# Patient Record
Sex: Male | Born: 1955 | Race: White | Hispanic: No | Marital: Married | Smoking: Light tobacco smoker
Health system: Southern US, Community
[De-identification: ages and names within clinical notes are randomized; demographics above are authoritative.]

## PROBLEM LIST (undated history)

## (undated) DIAGNOSIS — T7840XA Allergy, unspecified, initial encounter: Secondary | ICD-10-CM

## (undated) DIAGNOSIS — J45909 Unspecified asthma, uncomplicated: Secondary | ICD-10-CM

## (undated) DIAGNOSIS — C4490 Unspecified malignant neoplasm of skin, unspecified: Secondary | ICD-10-CM

## (undated) DIAGNOSIS — Z9989 Dependence on other enabling machines and devices: Secondary | ICD-10-CM

## (undated) DIAGNOSIS — J449 Chronic obstructive pulmonary disease, unspecified: Secondary | ICD-10-CM

## (undated) DIAGNOSIS — M1990 Unspecified osteoarthritis, unspecified site: Secondary | ICD-10-CM

## (undated) DIAGNOSIS — K219 Gastro-esophageal reflux disease without esophagitis: Secondary | ICD-10-CM

## (undated) DIAGNOSIS — I2510 Atherosclerotic heart disease of native coronary artery without angina pectoris: Secondary | ICD-10-CM

## (undated) DIAGNOSIS — I10 Essential (primary) hypertension: Secondary | ICD-10-CM

## (undated) DIAGNOSIS — G4733 Obstructive sleep apnea (adult) (pediatric): Secondary | ICD-10-CM

## (undated) DIAGNOSIS — N059 Unspecified nephritic syndrome with unspecified morphologic changes: Secondary | ICD-10-CM

## (undated) DIAGNOSIS — G4730 Sleep apnea, unspecified: Secondary | ICD-10-CM

## (undated) HISTORY — PX: WISDOM TOOTH EXTRACTION: SHX21

## (undated) HISTORY — DX: Sleep apnea, unspecified: G47.30

## (undated) HISTORY — PX: SKIN BIOPSY: SHX1

## (undated) HISTORY — DX: Unspecified osteoarthritis, unspecified site: M19.90

## (undated) HISTORY — DX: Gastro-esophageal reflux disease without esophagitis: K21.9

## (undated) HISTORY — DX: Unspecified asthma, uncomplicated: J45.909

## (undated) HISTORY — PX: COLONOSCOPY: SHX174

## (undated) HISTORY — DX: Unspecified malignant neoplasm of skin, unspecified: C44.90

## (undated) HISTORY — DX: Dependence on other enabling machines and devices: Z99.89

## (undated) HISTORY — DX: Obstructive sleep apnea (adult) (pediatric): G47.33

## (undated) HISTORY — PX: FINGER SURGERY: SHX640

## (undated) HISTORY — DX: Allergy, unspecified, initial encounter: T78.40XA

---

## 2010-10-27 HISTORY — PX: CORONARY ANGIOPLASTY: SHX604

## 2014-09-04 ENCOUNTER — Emergency Department (HOSPITAL_COMMUNITY): Payer: 59

## 2014-09-04 ENCOUNTER — Encounter (HOSPITAL_COMMUNITY): Payer: Self-pay | Admitting: Emergency Medicine

## 2014-09-04 ENCOUNTER — Emergency Department (HOSPITAL_COMMUNITY)
Admission: EM | Admit: 2014-09-04 | Discharge: 2014-09-04 | Disposition: A | Discharge status: Home / Self Care | Payer: 59 | Attending: Emergency Medicine | Admitting: Emergency Medicine

## 2014-09-04 DIAGNOSIS — R0789 Other chest pain: Secondary | ICD-10-CM | POA: Insufficient documentation

## 2014-09-04 DIAGNOSIS — I2510 Atherosclerotic heart disease of native coronary artery without angina pectoris: Secondary | ICD-10-CM | POA: Diagnosis not present

## 2014-09-04 DIAGNOSIS — I10 Essential (primary) hypertension: Secondary | ICD-10-CM | POA: Diagnosis not present

## 2014-09-04 DIAGNOSIS — J449 Chronic obstructive pulmonary disease, unspecified: Secondary | ICD-10-CM | POA: Insufficient documentation

## 2014-09-04 DIAGNOSIS — R9389 Abnormal findings on diagnostic imaging of other specified body structures: Secondary | ICD-10-CM

## 2014-09-04 DIAGNOSIS — Z87448 Personal history of other diseases of urinary system: Secondary | ICD-10-CM | POA: Insufficient documentation

## 2014-09-04 DIAGNOSIS — R079 Chest pain, unspecified: Secondary | ICD-10-CM

## 2014-09-04 DIAGNOSIS — Z85828 Personal history of other malignant neoplasm of skin: Secondary | ICD-10-CM | POA: Diagnosis not present

## 2014-09-04 DIAGNOSIS — Z87891 Personal history of nicotine dependence: Secondary | ICD-10-CM | POA: Insufficient documentation

## 2014-09-04 DIAGNOSIS — R918 Other nonspecific abnormal finding of lung field: Secondary | ICD-10-CM | POA: Insufficient documentation

## 2014-09-04 HISTORY — DX: Chronic obstructive pulmonary disease, unspecified: J44.9

## 2014-09-04 HISTORY — DX: Essential (primary) hypertension: I10

## 2014-09-04 HISTORY — DX: Atherosclerotic heart disease of native coronary artery without angina pectoris: I25.10

## 2014-09-04 HISTORY — DX: Unspecified nephritic syndrome with unspecified morphologic changes: N05.9

## 2014-09-04 LAB — BASIC METABOLIC PANEL
ANION GAP: 15 (ref 5–15)
BUN: 12 mg/dL (ref 6–23)
CHLORIDE: 98 meq/L (ref 96–112)
CO2: 24 meq/L (ref 19–32)
Calcium: 9.2 mg/dL (ref 8.4–10.5)
Creatinine, Ser: 0.88 mg/dL (ref 0.50–1.35)
GFR calc non Af Amer: >90 mL/min (ref >90)
Glucose, Bld: 105 mg/dL — ABNORMAL HIGH (ref 70–99)
Potassium: 4.6 mEq/L (ref 3.7–5.3)
SODIUM: 137 meq/L (ref 137–147)

## 2014-09-04 LAB — I-STAT TROPONIN, ED: Troponin i, poc: 0.02 ng/mL (ref 0.00–0.08)

## 2014-09-04 LAB — CBC
HEMATOCRIT: 46 % (ref 39.0–52.0)
HEMOGLOBIN: 15.9 g/dL (ref 13.0–17.0)
MCH: 30.6 pg (ref 26.0–34.0)
MCHC: 34.6 g/dL (ref 30.0–36.0)
MCV: 88.6 fL (ref 78.0–100.0)
Platelets: 214 10*3/uL (ref 150–400)
RBC: 5.19 MIL/uL (ref 4.22–5.81)
RDW: 13 % (ref 11.5–15.5)
WBC: 6.4 10*3/uL (ref 4.0–10.5)

## 2014-09-04 NOTE — ED Notes (Signed)
Pt states he wake up earlier today when to the gym and while exercising he started feeling some CP that he describe as chest pressure and some nausea.

## 2014-09-04 NOTE — Discharge Instructions (Signed)

## 2014-09-04 NOTE — ED Notes (Signed)
Patient transported to X-ray 

## 2014-09-04 NOTE — ED Provider Notes (Signed)
CSN: [id number]     Arrival date & time 09/04/14  4259 History   First MD Initiated Contact with Patient 09/04/14 0701     Chief Complaint  Patient presents with  . Chest Pain  . Nausea     (Consider location/radiation/quality/duration/timing/severity/associated sxs/prior Treatment) HPI   58yM with CP and nausea. Onset earlier this morning. Pt was at gym an felt fine lifting weights. Then went to exercise bike and during this began to have nausea and feel dizzy. Stopped because of this. Symptoms began to get worse so cut his work out short. Tightness in chest, upper back, shoulders but he has had this for past week which he attributes to working out this different gym equipment than he is use to. Pt just moved to [place] from [place] ~2 week ago. Symptoms resolved over about 30 minutes. Currently no complaints. Medical hx lists CAD, but pt denies. Says he had catheterization 3 years ago and coronary arteries "were pristine." "My cardiologist told me I would die from something else before I ever developed blockages." Hx of COPD / asthma. Symptoms this morning did not feel like this. No fever or chills. No cough. No unusual leg pain or swelling. No established medical care in area yet.   Past Medical History  Diagnosis Date  . Coronary artery disease   . COPD (chronic obstructive pulmonary disease)   . Nephritis   . Hypertension   . Cancer     skin   Past Surgical History  Procedure Laterality Date  . Skin biopsy    . Coronary angiogram     History reviewed. No pertinent family history. History  Substance Use Topics  . Smoking status: Former Smoker    Types: Cigarettes  . Smokeless tobacco: Former Systems developer  . Alcohol Use: Yes     Comment: weekends    Review of Systems  All systems reviewed and negative, other than as noted in HPI.   Allergies  Review of patient's allergies indicates not on file.  Home Medications   Prior to Admission medications   Not on File   BP  126/73 mmHg  Pulse 65  Temp(Src) 97.5 F (36.4 C) (Oral)  Resp 12  Ht 5\' 6"  (1.676 m)  Wt 245 lb (111.131 kg)  BMI 39.56 kg/m2  SpO2 100% Physical Exam  Constitutional: He appears well-developed and well-nourished. No distress.  Laying in bed. NAD. Obese.   HENT:  Head: Normocephalic and atraumatic.  Eyes: Conjunctivae are normal. Right eye exhibits no discharge. Left eye exhibits no discharge.  Neck: Neck supple.  Cardiovascular: Normal rate, regular rhythm and normal heart sounds.  Exam reveals no gallop and no friction rub.   No murmur heard. Pulmonary/Chest: Effort normal and breath sounds normal. No respiratory distress. He exhibits no tenderness.  Abdominal: Soft. He exhibits no distension. There is no tenderness.  Musculoskeletal: He exhibits no edema or tenderness.  Lower extremities symmetric as compared to each other. No calf tenderness. Negative Homan's. No palpable cords.   Neurological: He is alert.  Skin: Skin is warm and dry. He is not diaphoretic.  Psychiatric: He has a normal mood and affect. His behavior is normal. Thought content normal.  Nursing note and vitals reviewed.   ED Course  Procedures (including critical care time) Labs Review Labs Reviewed  BASIC METABOLIC PANEL - Abnormal; Notable for the following:    Glucose, Bld 105 (*)    All other components within normal limits  CBC  I-STAT TROPOININ, ED  Imaging Review Dg Chest 2 View  09/04/2014   CLINICAL DATA:  Chest tightness, shoulder tightness, chest pain, began today when he went to the [facility] hypertension, former smoker, personal history of coronary artery disease, COPD, hypertension, cancer (type not specified in EHR)  EXAM: CHEST  2 VIEW  COMPARISON:  None  FINDINGS: Normal heart size and pulmonary vascularity.  Density at the RIGHT cardiophrenic angle could represent a prominent epicardial fat pad though mass is not completely excluded.  Lungs clear.  No pleural effusion or pneumothorax.   Minimal broad-based thoracic dextro convex scoliosis.  IMPRESSION: No acute cardiopulmonary abnormalities identified.  Question prominent fat pad at RIGHT cardiophrenic angle though mass not entirely excluded.  If patient has any prior outside chest radiographs recommend these be obtained for comparison to assess stability.  In the absence of prior exams, noncontrast CT chest recommended to exclude mass.   Electronically Signed   By: Lavonia Dana M.D.   On: 09/04/2014 08:15   Ct Chest Wo Contrast  09/04/2014   CLINICAL DATA:  Abnormal HGD:JMEQASTM prominent fat pad at RIGHT cardiophrenic angle, though mass is not entirely excluded. Per Radiologist recommendation.  EXAM: CT CHEST WITHOUT CONTRAST  TECHNIQUE: Multidetector CT imaging of the chest was performed following the standard protocol without IV contrast.  COMPARISON:  Chest x-ray on 09/04/2014  FINDINGS: Heart: Heart size is normal. No pericardial effusion or significant coronary artery calcification.  Vascular structures: Normal arch anatomy.  No evidence for aneurysm.  Mediastinum/thyroid: No mediastinal, hilar, or axillary adenopathy. The visualized portion of the thyroid gland has a normal appearance. A prominent right pericardial fat pad accounts for the chest x-ray abnormality.  Lungs/Airways: No pulmonary nodules, pleural effusions, or infiltrates are identified.  Upper abdomen: Right renal cyst is identified. The gallbladder is present. The adrenal glands have a normal appearance.  Chest wall/osseous structures: Negative.  IMPRESSION: 1. Abnormality on recent chest x-ray is a prominent pericardial fat pad, benign. 2.  No evidence for acute  abnormality.   Electronically Signed   By: Shon Hale M.D.   On: 09/04/2014 09:22     EKG Interpretation   Date/Time:  Monday September 04 2014 06:56:40 EST Ventricular Rate:  61 PR Interval:  247 QRS Duration: 83 QT Interval:  398 QTC Calculation: 401 R Axis:   -27 Text Interpretation:  Sinus rhythm  Prolonged PR interval Baseline wander  in lead(s) V1 Non-specific ST-t changes No old tracing to compare  Confirmed by Edith Endave  MD, Clayvon Parlett ([id number]) on 09/04/2014 7:03:05 AM      MDM   Final diagnoses:  Abnormal CXR  Chest pain, unspecified chest pain type    58yM with CP. Symptoms somewhat atypical. CP/shoulder pain/upper back pain has been ongoing for last week before episodes of nausea/dizziness today. Actually worsened after getting off bike. Currently symptom free. EKG with nonspecific changes. No old for comparison. Lungs clear. No cough. Doubt PE, infectious, dissection, or other emergent pathology.   Will check CXR. Basic labs and trop.   W/u fairly unremarkable aside from CXR. Unfortunately, no old records. Pt not sure of specifics of prior chest radiographs. No local established care. Will CT today.   CT reassuring. Low risk CP. I feel safe for DC. Outpt FU information provided.   Virgel Manifold, MD 09/04/14 1018

## 2014-09-06 ENCOUNTER — Encounter: Payer: 59 | Admitting: Cardiovascular Disease

## 2014-09-06 NOTE — Progress Notes (Signed)
No show

## 2014-09-17 NOTE — Progress Notes (Signed)
Patient ID: Charles Mosley, male   DOB: December 19, 1955, 58 y.o.   MRN: [id number]    58 yo seen in ER for nausea an chest pain 11/9  D/c with negative w/u   Pain in early morning . Pt was at gym an felt fine lifting weights. Then went to exercise bike and during this began to have nausea and feel dizzy. Stopped because of this. Symptoms began to get worse so cut his work out short. Tightness in chest, upper back, shoulders but he has had this for past week which he attributes to working out this different gym equipment than he is use to. Pt just moved to [place] from [place]  At end of October . Symptoms resolved over about 30 minutes. Currently no complaints. Medical hx lists CAD, but pt denies. Says he had catheterization 3 years ago and coronary arteries "were pristine." "My cardiologist told me I would die from something else before I ever developed blockages." Hx of COPD / asthma.    CXR 11/10 reviewed an NA   Despite being overweight he goes to gym regularly with no pain or tightness.  Moved here with wife who is working at [employer].  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  All other systems reviewed and negative   General: Affect appropriate Overweight white male  HEENT: normal Neck supple with no adenopathy JVP normal no bruits no thyromegaly Lungs clear with no wheezing and good diaphragmatic motion Heart:  S1/S2 no murmur,rub, gallop or click PMI normal Abdomen: benighn, BS positve, no tenderness, no AAA no bruit.  No HSM or HJR Distal pulses intact with no bruits No edema Neuro non-focal Skin warm and dry No muscular weakness  Medications Current Outpatient Prescriptions  Medication Sig Dispense Refill  . albuterol (PROVENTIL  HFA;VENTOLIN HFA) 108 (90 BASE) MCG/ACT inhaler Inhale 1 puff into the lungs every 6 (six) hours as needed for wheezing or shortness of breath.    Marland Kitchen aspirin EC 81 MG tablet Take 81 mg by mouth every evening.    . Aspirin-Salicylamide-Caffeine (BC HEADACHE PO) Take 1 packet by mouth daily as needed (for headache).    . docusate sodium (COLACE) 100 MG capsule Take 100 mg by mouth daily as needed for mild constipation.    . fexofenadine (ALLEGRA) 60 MG tablet Take 60 mg by mouth daily.    . Fluticasone-Salmeterol (ADVAIR) 250-50 MCG/DOSE AEPB Inhale 1 puff into the lungs 2 (two) times daily.    . montelukast (SINGULAIR) 10 MG tablet Take 10 mg by mouth every evening.    . nebivolol (BYSTOLIC) 5 MG tablet Take 2.5 mg by mouth every evening.    . valsartan-hydrochlorothiazide (DIOVAN-HCT) 160-25 MG per tablet Take 1 tablet by mouth every evening.     No current facility-administered medications for this visit.    Allergies Review of patient's allergies indicates no known allergies.  Family History: No family history on file.  Social History: History   Social History  . Marital Status: Married    Spouse Name: N/A    Number of Children: N/A  . Years of Education: N/A   Occupational History  . Not on file.   Social History Main Topics  . Smoking status: Former Smoker    Types: Cigarettes  . Smokeless tobacco: Former Systems developer  .  Alcohol Use: Yes     Comment: weekends  . Drug Use: No  . Sexual Activity: Not on file   Other Topics Concern  . Not on file   Social History Narrative  . No narrative on file    Past Surgical History  Procedure Laterality Date  . Skin biopsy    . Coronary angiogram      Past Medical History  Diagnosis Date  . Coronary artery disease   . COPD (chronic obstructive pulmonary disease)   . Nephritis   . Hypertension   . Cancer     skin    Electrocardiogram:  11/10  SR rate 67 poor R wave progression PR 247   Assessment and Plan

## 2014-09-18 ENCOUNTER — Ambulatory Visit (INDEPENDENT_AMBULATORY_CARE_PROVIDER_SITE_OTHER): Payer: 59 | Admitting: Cardiovascular Disease

## 2014-09-18 ENCOUNTER — Encounter: Payer: Self-pay | Admitting: Cardiovascular Disease

## 2014-09-18 VITALS — BP 134/68 | HR 71 | Ht 66.0 in | Wt 239.8 lb

## 2014-09-18 DIAGNOSIS — R079 Chest pain, unspecified: Secondary | ICD-10-CM | POA: Insufficient documentation

## 2014-09-18 DIAGNOSIS — E663 Overweight: Secondary | ICD-10-CM

## 2014-09-18 DIAGNOSIS — R072 Precordial pain: Secondary | ICD-10-CM

## 2014-09-18 DIAGNOSIS — I10 Essential (primary) hypertension: Secondary | ICD-10-CM

## 2014-09-18 NOTE — Assessment & Plan Note (Signed)
Atypical Likely related to new gym equipment and exercises.  Previous cath with normal cors and negative ER w/u  F/U in a year Can do ETT in future if he has more symptoms that are typical

## 2014-09-18 NOTE — Assessment & Plan Note (Signed)
Well controlled.  Continue current medications and low sodium Dash type diet.    

## 2014-09-18 NOTE — Patient Instructions (Signed)
Your physician wants you to follow-up in:  [place] will receive a reminder letter in the mail two months in advance. If you don't receive a letter, please call our office to schedule the follow-up appointment. Your physician recommends that you continue on your current medications as directed. Please refer to the Current Medication list given to you today.

## 2014-09-18 NOTE — Assessment & Plan Note (Signed)
Discussed low carb diet.  Also discussed more aerobic activities like recumbent bike and elliptical

## 2014-10-02 ENCOUNTER — Encounter: Payer: 59 | Admitting: Cardiovascular Disease

## 2015-02-08 ENCOUNTER — Institutional Professional Consult (permissible substitution): Payer: Self-pay | Admitting: Internal Medicine

## 2015-02-13 ENCOUNTER — Encounter: Payer: Self-pay | Admitting: Internal Medicine

## 2015-02-13 ENCOUNTER — Ambulatory Visit (INDEPENDENT_AMBULATORY_CARE_PROVIDER_SITE_OTHER): Payer: BC Managed Care – PPO | Admitting: Internal Medicine

## 2015-02-13 VITALS — BP 136/80 | HR 56 | Ht 66.0 in | Wt 254.4 lb

## 2015-02-13 DIAGNOSIS — G4730 Sleep apnea, unspecified: Secondary | ICD-10-CM

## 2015-02-13 DIAGNOSIS — J4540 Moderate persistent asthma, uncomplicated: Secondary | ICD-10-CM

## 2015-02-13 DIAGNOSIS — J4541 Moderate persistent asthma with (acute) exacerbation: Secondary | ICD-10-CM | POA: Insufficient documentation

## 2015-02-13 DIAGNOSIS — G4733 Obstructive sleep apnea (adult) (pediatric): Secondary | ICD-10-CM | POA: Insufficient documentation

## 2015-02-13 NOTE — Progress Notes (Signed)
Subjective:    Patient ID: Charles Mosley, male    DOB: 06-07-1956, 59 y.o.   MRN: [id number] PCP Eloise Levels, NP  HPI  IOV 02/13/2015  Chief Complaint  Patient presents with  . :Pulmonary Consult    Self Referral - Pt moved here from [place] and needs to establish with new pulmonary md -Asthma with some copd - Asthma worsened 3 years ago - Occas sob after eating or with increased stress - Denies cough or wheezing  - Pt also uses cpap    59 year-old morbidly obese male. Used to work for [employer] post and [place] today. 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 Wife also relocated here to work for [employer]. He reports a diagnosis of asthma made over 10 years ago. He's been on Advair which she takes only 1 puff at night along with Singulair for the last few years. His last visit with pulmonologist Dr. Meredith Pel in [place] was he says over a year ago. Last prednisone was over a year ago. Last permit function tests and chest x-ray was over a year ago. Limited outside records from Dr. Meredith Pel shows that he had a clear chest x-ray in December 2014. He was also subjected to methacholine challenge test per the results are known. Patient himself does not recollect any of these test results. Currently feels his asthma is extremely well controlled. His last albuterol use was several months ago. On the 5. asthma control test score was 22 showing good control  Specifically in the last 4 weeks he does not feel his asthma is preventing him from getting any of his work done. He admits to shortness of breath 3-6 times a week in the last 4 weeks. But he does not have any other asthma symptoms at this wheezing coughing chest tightness awaking up at night. Also in the last 4 weeks is not uses albuterol for rescue. Overall he feels his asthma is well controlled.  He does have chronic sleep apnea and uses CPAP but he does not have a sleep  Dr.  Marcelline Deist main purpose of this visit today is to establish care in [place] and get refills  His exhaled nitric oxide FeNO is 24 ppb and shows good control of asthma   Asthma Control Panel 02/13/2015   Current Med Regimen advair at night + singulair at night  ACQ 5 point- 1 week (wtd avg score) x  ACQ 7 point - 1 week (wtd avg score) x  ACT - 4 week (total score, <19= symptoms) 22  FeNO ppB 24 ppb on 02/13/2015   FeV1  x  Planned intervention  for visit cotninue advair/singulair - no change in baseline regime       has a past medical history of Coronary artery disease; COPD (chronic obstructive pulmonary disease); Nephritis; Hypertension; Cancer; and Sleep apnea.   reports that he has been smoking Cigars.  He has quit using smokeless tobacco. he says that he picked up only smoking 10 years ago and he smokes anywhere between 6 and 20 cigars in a single year when  outdoors. Otherwise he doesn't smoke much  Past Surgical History  Procedure Laterality Date  . Skin biopsy    . Coronary angiogram      No Known Allergies  Immunization History  Administered Date(s) Administered  . Influenza Split 07/27/2014    Family History  Problem Relation Age of Onset  . Cancer Mother   .  Emphysema Father      Current outpatient prescriptions:  .  albuterol (PROVENTIL HFA;VENTOLIN HFA) 108 (90 BASE) MCG/ACT inhaler, Inhale 1 puff into the lungs every 6 (six) hours as needed for wheezing or shortness of breath., Disp: , Rfl:  .  aspirin EC 81 MG tablet, Take 81 mg by mouth every evening., Disp: , Rfl:  .  docusate sodium (COLACE) 100 MG capsule, Take 100 mg by mouth daily as needed for mild constipation., Disp: , Rfl:  .  fexofenadine (ALLEGRA) 60 MG tablet, Take 60 mg by mouth daily., Disp: , Rfl:  .  Fluticasone-Salmeterol (ADVAIR) 250-50 MCG/DOSE AEPB, Inhale 1 puff into the lungs 2 (two) times daily., Disp: , Rfl:  .  montelukast (SINGULAIR) 10 MG tablet, Take 10 mg by mouth every  evening., Disp: , Rfl:  .  nebivolol (BYSTOLIC) 5 MG tablet, Take 2.5 mg by mouth every evening., Disp: , Rfl:  .  valsartan (DIOVAN) 160 MG tablet, Take 160 mg by mouth daily., Disp: , Rfl:      Review of Systems  Constitutional: Negative.   HENT: Negative.   Eyes: Negative.   Respiratory: Positive for apnea and shortness of breath. Negative for cough, choking, chest tightness, wheezing and stridor.   Cardiovascular: Negative.   Gastrointestinal: Negative.   Endocrine: Negative.   Genitourinary: Negative.   Musculoskeletal: Negative.   Skin: Negative.   Allergic/Immunologic: Negative.   Neurological: Negative.   Hematological: Negative.   Psychiatric/Behavioral: Negative.        Objective:   Physical Exam  Constitutional: He is oriented to person, place, and time. He appears well-developed and well-nourished. No distress.  obese  HENT:  Head: Normocephalic and atraumatic.  Right Ear: External ear normal.  Left Ear: External ear normal.  Mouth/Throat: Oropharynx is clear and moist. No oropharyngeal exudate.  mallampatti class 3  Eyes: Conjunctivae and EOM are normal. Pupils are equal, round, and reactive to light. Right eye exhibits no discharge. Left eye exhibits no discharge. No scleral icterus.  Neck: Normal range of motion. Neck supple. No JVD present. No tracheal deviation present. No thyromegaly present.  Cardiovascular: Normal rate, regular rhythm and intact distal pulses.  Exam reveals no gallop and no friction rub.   No murmur heard. Pulmonary/Chest: Effort normal and breath sounds normal. No respiratory distress. He has no wheezes. He has no rales. He exhibits no tenderness.  Abdominal: Soft. Bowel sounds are normal. He exhibits no distension and no mass. There is no tenderness. There is no rebound and no guarding.  Musculoskeletal: Normal range of motion. He exhibits no edema or tenderness.  Lymphadenopathy:    He has no cervical adenopathy.  Neurological: He is  alert and oriented to person, place, and time. He has normal reflexes. No cranial nerve deficit. Coordination normal.  Skin: Skin is warm and dry. No rash noted. He is not diaphoretic. No erythema. No pallor.  Psychiatric: He has a normal mood and affect. His behavior is normal. Judgment and thought content normal.  Nursing note and vitals reviewed.   Filed Vitals:   02/13/15 1123  BP: 136/80  Pulse: 56  Height: 5\' 6"  (1.676 m)  Weight: 254 lb 6.4 oz (115.395 kg)  SpO2: 97%         Assessment & Plan:     ICD-9-CM ICD-10-CM   1. Moderate persistent asthma, uncomplicated 268.34 H96.22 Pulmonary function test  2. Sleep apnea 780.57 G47.30 Pulmonary function test    Asthma appears well controlled based on  ACT questions + FeNO Continue advair 1 puff twice daily  Continue singulair at night daily Continue albuterol as needed Obtain outside records from Dr Meredith Pel in [place], [place]  Please see a sleep doc in our office next 3 months  Followup  - see sleep doc in our office next 3 months anytime  - full pft in  6 months - return to see me in 6 months after PFT   Dr. Brand Males, M.D., [facility].C.P Pulmonary and Critical Care Medicine Staff Physician [facility] Pager: [phone], If no answer or between  15:00h - 7:00h: call [phone]  02/13/2015 11:58 AM

## 2015-02-13 NOTE — Patient Instructions (Addendum)
ICD-9-CM ICD-10-CM   1. Moderate persistent asthma, uncomplicated 789.38 B01.75   2. Sleep apnea 780.57 G47.30    Asthma appears well controlled Continue advair 1 puff twice daily Continue singulair at night daily Continue albuterol as needed Obtain outside records from Dr Meredith Pel in [place], [place]  Please see a sleep doc in our office next 3 months  Followup  - see sleep doc in our office next 3 months anytime  - full pft in  6 months - return to see me in 6 months after PFT

## 2015-05-18 ENCOUNTER — Encounter: Payer: Self-pay | Admitting: Internal Medicine

## 2015-05-18 ENCOUNTER — Ambulatory Visit (INDEPENDENT_AMBULATORY_CARE_PROVIDER_SITE_OTHER): Payer: BC Managed Care – PPO | Admitting: Internal Medicine

## 2015-05-18 VITALS — BP 112/82 | HR 56 | Ht 67.0 in | Wt 232.2 lb

## 2015-05-18 DIAGNOSIS — G4733 Obstructive sleep apnea (adult) (pediatric): Secondary | ICD-10-CM | POA: Diagnosis not present

## 2015-05-18 DIAGNOSIS — E663 Overweight: Secondary | ICD-10-CM

## 2015-05-18 NOTE — Assessment & Plan Note (Signed)
He indicates 30 pound weight loss over the last 2 years. He is encouraged to continue losing weight which would significantly help with management of this obstructive sleep apnea.

## 2015-05-18 NOTE — Patient Instructions (Signed)
Order- Needs to establish local DME for continuation of CPAP - auto 5-15, mask of choice, humidifier, supplies    Dx OSA  Please call as needed

## 2015-05-18 NOTE — Assessment & Plan Note (Signed)
We have requested a copy of his diagnostic sleep study from his previous physician in [place]. Download documents good compliance and control with an auto Pap range between 5 and 15. He tolerates that very well so we will not change. He continues with a fullface mask. We discussed comfort and compliance guidelines. Plan-establish local DME for continuation of CPAP auto 5-15

## 2015-05-18 NOTE — Progress Notes (Signed)
05/18/15- 59 yoM light cigar smoker referred by Dr Chase Caller for sleep medicine evaluation with dx OSA Follow For: Sleep Apnea. Pt had home sleep study done about 3 years ago in [place]. He has now moved to this area and needs to establish a DME company. Medical problems include asthma, HBP Download from [facility] CPAP/ [employer] indicates good compliance and control on auto 5-15. He has been very comfortable with that pressure range using a fullface mask. His current machine is about 61 years old and seen to be working well. He thinks he has lost 30 pounds in the last 2 years but remains heavy. Snoring has been stopped by CPAP and he feels rested. History of multiple nasal fractures, most from playing softball he does not breathe well through his nose.  Prior to Admission medications   Medication Sig Start Date End Date Taking? Authorizing Provider  albuterol (PROVENTIL HFA;VENTOLIN HFA) 108 (90 BASE) MCG/ACT inhaler Inhale 1 puff into the lungs every 6 (six) hours as needed for wheezing or shortness of breath.   Yes Historical Provider, MD  aspirin EC 81 MG tablet Take 81 mg by mouth every evening.   Yes Historical Provider, MD  BYSTOLIC 2.5 MG tablet Take 2.5 mg by mouth daily. 05/10/15  Yes Historical Provider, MD  docusate sodium (COLACE) 100 MG capsule Take 100 mg by mouth daily as needed for mild constipation.   Yes Historical Provider, MD  fexofenadine (ALLEGRA) 60 MG tablet Take 60 mg by mouth daily.   Yes Historical Provider, MD  Fluticasone-Salmeterol (ADVAIR) 250-50 MCG/DOSE AEPB Inhale 1 puff into the lungs 2 (two) times daily.   Yes Historical Provider, MD  montelukast (SINGULAIR) 10 MG tablet Take 10 mg by mouth every evening.   Yes Historical Provider, MD  valsartan (DIOVAN) 160 MG tablet Take 160 mg by mouth daily.   Yes Historical Provider, MD   Past Medical History  Diagnosis Date  . Coronary artery disease   . COPD (chronic obstructive pulmonary disease)   . Nephritis    . Hypertension   . Cancer     skin  . Sleep apnea    Past Surgical History  Procedure Laterality Date  . Skin biopsy    . Coronary angiogram     Family History  Problem Relation Age of Onset  . Cancer Mother   . Emphysema Father    History   Social History  . Marital Status: Married    Spouse Name: N/A  . Number of Children: N/A  . Years of Education: N/A   Occupational History  . Unemployed     Social History Main Topics  . Smoking status: Light Tobacco Smoker    Types: Cigars  . Smokeless tobacco: Former Systems developer  . Alcohol Use: Yes     Comment: weekends  . Drug Use: No  . Sexual Activity: Not on file   Other Topics Concern  . Not on file   Social History Narrative   ROS-see HPI   Negative unless "+" Constitutional:    weight loss, night sweats, fevers, chills, fatigue, lassitude. HEENT:    headaches, difficulty swallowing, tooth/dental problems, sore throat,       sneezing, itching, ear ache, +nasal congestion, post nasal drip, snoring CV:    chest pain, orthopnea, PND, swelling in lower extremities, anasarca,  dizziness, palpitations Resp:   +shortness of breath with exertion or at rest.                productive cough,   non-productive cough, coughing up of blood.              change in color of mucus.  wheezing.   Skin:    rash or lesions. GI:  +heartburn, indigestion, abdominal pain, nausea, vomiting, diarrhea,                 change in bowel habits, loss of appetite GU: dysuria, change in color of urine, no urgency or frequency.   flank pain. MS:   joint pain, stiffness, decreased range of motion, back pain. Neuro-     nothing unusual Psych:  change in mood or affect.  depression or anxiety.   memory loss.  OBJ- Physical Exam General- Alert, Oriented, Affect-appropriate, Distress- none acute, + morbidly obese Skin- rash-none, lesions- none, excoriation- none Lymphadenopathy- none Head- atraumatic             Eyes- Gross vision intact, PERRLA, conjunctivae and secretions clear            Ears- Hearing, canals-normal            Nose- Clear, no-Septal dev, mucus, polyps, erosion, perforation             Throat- Mallampati II - III, mucosa clear , drainage- none, tonsils- atrophic Neck- flexible , trachea midline, no stridor , thyroid nl, carotid no bruit Chest - symmetrical excursion , unlabored           Heart/CV- RRR , no murmur , no gallop  , no rub, nl s1 s2                           - JVD- none , edema- none, stasis changes- none, varices- none           Lung- clear to P&A, wheeze- none, cough- none , dullness-none, rub- none           Chest wall-  Abd-  Br/ Gen/ Rectal- Not done, not indicated Extrem- cyanosis- none, clubbing, none, atrophy- none, strength- nl Neuro- grossly intact to observation

## 2015-07-03 ENCOUNTER — Encounter: Payer: Self-pay | Admitting: *Deleted

## 2015-07-04 ENCOUNTER — Ambulatory Visit: Payer: BC Managed Care – PPO | Admitting: Cardiology

## 2015-07-05 ENCOUNTER — Telehealth: Payer: Self-pay | Admitting: Internal Medicine

## 2015-07-05 MED ORDER — MONTELUKAST SODIUM 10 MG PO TABS: 10.0000 mg | ORAL_TABLET | Freq: Every evening | ORAL | Status: DC

## 2015-07-05 NOTE — Telephone Encounter (Signed)
Called spoke with pt. He needs refill on [facility]air [facility]. I have done so. Nothing further needed

## 2015-07-10 ENCOUNTER — Telehealth: Payer: Self-pay | Admitting: Internal Medicine

## 2015-07-10 NOTE — Telephone Encounter (Signed)
Singulair rx sent to [facility] on 07/05/15 for #90 x 0. Spoke with pt and advised of above.  Pt states he did speak with [facility].  Nothing further needed at this time.  Pt will call back if anything further is needed.

## 2015-07-16 ENCOUNTER — Ambulatory Visit (INDEPENDENT_AMBULATORY_CARE_PROVIDER_SITE_OTHER): Payer: BC Managed Care – PPO | Admitting: Cardiology

## 2015-07-16 ENCOUNTER — Encounter: Payer: Self-pay | Admitting: Cardiology

## 2015-07-16 VITALS — BP 128/84 | HR 57 | Ht 66.0 in | Wt 236.1 lb

## 2015-07-16 DIAGNOSIS — I10 Essential (primary) hypertension: Secondary | ICD-10-CM

## 2015-07-16 NOTE — Progress Notes (Signed)
Cardiology Office Note   Date:  07/16/2015   ID:  Charles Mosley, DOB 22-Jul-1956, MRN [id number]  PCP:  Eloise Levels, NP  Cardiologist:  Dr. Johnsie Cancel    Chief Complaint  Patient presents with  . Chest Pain    pt c/o no chest pain or any other problems  . Shortness of Breath    some      History of Present Illness: Coal Nearhood is a 59 y.o. male who presents for follow up for chest pain, though no cardiac chest pain.  He does have brief episode after wt lifting that resolves with stretching.  No chest pain, no SOB.  He does wt lifting MWF and bike those days , other days elliptical.  His diet is low on fruit and vegetables.  No diabetes.  His labs followed by PCP ie lipids.  He knows he needs follow up with them in next few months.   He had a cardiac catheterization 4 years ago and coronary arteries "were pristine." "My cardiologist told me I would die from something else before I ever developed blockages." Hx of COPD / asthma followed by pulmonary.   Past Medical History  Diagnosis Date  . Coronary artery disease   . COPD (chronic obstructive pulmonary disease)   . Nephritis   . Hypertension   . Skin cancer   . OSA on CPAP   . Asthma     Past Surgical History  Procedure Laterality Date  . Skin biopsy    . Coronary angioplasty  2012     Current Outpatient Prescriptions  Medication Sig Dispense Refill  . albuterol (PROVENTIL HFA;VENTOLIN HFA) 108 (90 BASE) MCG/ACT inhaler Inhale 1 puff into the lungs every 6 (six) hours as needed for wheezing or shortness of breath.    Marland Kitchen aspirin EC 81 MG tablet Take 81 mg by mouth every evening.    Marland Kitchen BYSTOLIC 2.5 MG tablet Take 2.5 mg by mouth daily.    Marland Kitchen docusate sodium (COLACE) 100 MG capsule Take 100 mg by mouth daily as needed for mild constipation.    . fexofenadine (ALLEGRA) 60 MG tablet Take 60 mg by mouth daily.    . Fluticasone-Salmeterol (ADVAIR) 250-50 MCG/DOSE AEPB Inhale 1 puff into the lungs 2 (two) times daily.      . montelukast (SINGULAIR) 10 MG tablet Take 1 tablet (10 mg total) by mouth every evening. 90 tablet 0  . valsartan (DIOVAN) 160 MG tablet Take 160 mg by mouth daily.     No current facility-administered medications for this visit.    Allergies:   Review of patient's allergies indicates no known allergies.    Social History:  The patient  reports that he has been smoking Cigars.  He has quit using smokeless tobacco. He reports that he drinks alcohol. He reports that he does not use illicit drugs.   Family History:  The patient's family history includes Cancer in his mother; Emphysema in his father.    ROS:  General:no colds or fevers, + weight loss- 30 lbs per pt.  Skin:no rashes or ulcers HEENT:no blurred vision, no congestion CV:see HPI PUL:see HPI GI:no diarrhea constipation or melena, no indigestion GU:no hematuria, no dysuria MS:+ knee pain, no claudication, occ leg cramps Neuro:no syncope, no lightheadedness Endo:no diabetes, no thyroid disease  Wt Readings from Last 3 Encounters:  07/16/15 236 lb 1.6 oz (107.094 kg)  05/18/15 232 lb 3.2 oz (105.325 kg)  02/13/15 254 lb 6.4 oz (115.395 kg)  PHYSICAL EXAM: VS:  BP 128/84 mmHg  Pulse 57  Ht 5\' 6"  (1.676 m)  Wt 236 lb 1.6 oz (107.094 kg)  BMI 38.13 kg/m2 , BMI Body mass index is 38.13 kg/(m^2). General:Pleasant affect, NAD Skin:Warm and dry, brisk capillary refill HEENT:normocephalic, sclera clear, mucus membranes moist Neck:supple, no JVD, no bruits  Heart:S1S2 RRR without murmur, gallup, rub or click Lungs:clear without rales, rhonchi, or wheezes SFK:CLEXN, soft, non tender, + BS, do not palpate liver spleen or masses Ext:no lower ext edema, 2+ pedal pulses, 2+ radial pulses Neuro:alert and oriented X 3, MAE, follows commands, + facial symmetry    EKG:  EKG is ordered today. The ekg ordered today demonstrates SB no acute changes.    Recent Labs: 09/04/2014: BUN 12; Creatinine, Ser 0.88; Hemoglobin 15.9;  Platelets 214; Potassium 4.6; Sodium 137    Lipid Panel No results found for: CHOL, TRIG, HDL, CHOLHDL, VLDL, LDLCALC, LDLDIRECT     Other studies Reviewed: Additional studies/ records that were reviewed today include: previous notes.   ASSESSMENT AND PLAN:  1.  Muscular skeletal chest pain.  If symptoms increase or occur with exertion he will call us. Chol check per PCP along with meds.  Follow up with Dr. Johnsie Cancel in 1 year.  2. Obesity- he exercises freq and is working on his wt.      Current medicines are reviewed with the patient today.  The patient Has no concerns regarding medicines.  The following changes have been made:  See above Labs/ tests ordered today include:see above  Disposition:   FU:  see above  Lennie Muckle, NP  07/16/2015 4:17 PM    [facility] [address], [place], [place] Phone: [phone]; Fax: [phone]

## 2015-07-16 NOTE — Patient Instructions (Signed)
Your physician wants you to follow-up in: 1 year with Dr. Johnsie Cancel. You will receive a reminder letter in the mail two months in advance. If you don't receive a letter, please call our office to schedule the follow-up appointment.   Your physician recommends that you schedule a follow-up appointment in the near future with your primary care physician.

## 2015-07-25 ENCOUNTER — Ambulatory Visit: Payer: BC Managed Care – PPO | Admitting: Internal Medicine

## 2015-08-10 ENCOUNTER — Ambulatory Visit: Payer: BC Managed Care – PPO | Admitting: Internal Medicine

## 2015-09-14 ENCOUNTER — Ambulatory Visit: Payer: BC Managed Care – PPO | Admitting: Internal Medicine

## 2015-09-27 ENCOUNTER — Ambulatory Visit: Payer: BC Managed Care – PPO | Admitting: Cardiovascular Disease

## 2015-11-27 ENCOUNTER — Telehealth: Payer: Self-pay | Admitting: Internal Medicine

## 2015-11-27 NOTE — Telephone Encounter (Signed)
Pt is due for follow up w/ PFT's w/ MR. LMTCB x1

## 2015-11-28 MED ORDER — MONTELUKAST SODIUM 10 MG PO TABS: 10.0000 mg | ORAL_TABLET | Freq: Every evening | ORAL | Status: DC

## 2015-11-28 MED ORDER — ALBUTEROL SULFATE HFA 108 (90 BASE) MCG/ACT IN AERS: 1.0000 | INHALATION_SPRAY | Freq: Four times a day (QID) | RESPIRATORY_TRACT | Status: DC | PRN

## 2015-11-28 MED ORDER — FLUTICASONE-SALMETEROL 250-50 MCG/DOSE IN AEPB: 1.0000 | INHALATION_SPRAY | Freq: Two times a day (BID) | RESPIRATORY_TRACT | Status: DC

## 2015-11-28 NOTE — Telephone Encounter (Signed)
i can sign it

## 2015-11-28 NOTE — Telephone Encounter (Signed)
Patient Returned call [phone]

## 2015-11-28 NOTE — Telephone Encounter (Signed)
Patient lost his job and has gone through his 51K.  He says that he has been in contact with [facility] program to help pay for his prescriptions and he needs hand-written prescriptions to send with his application for his Advair, Singulair and Albuterol inhaler.  Dr. Chase Caller is out of office today, Dr. Lamonte Sakai, do you mind signing Rx so patient can pick up today?    Please advise.

## 2015-11-28 NOTE — Telephone Encounter (Signed)
Pt returning call.Charles Mosley ° °

## 2015-11-28 NOTE — Telephone Encounter (Signed)
LMTCB

## 2015-11-28 NOTE — Telephone Encounter (Signed)
LVM for patient to return call. 

## 2015-11-28 NOTE — Telephone Encounter (Signed)
Rx printed and signed by Dr. Lamonte Sakai. Placed at front for patient to pick up. Patient aware that Rx is ready for pick up. Nothing further needed.

## 2015-11-29 ENCOUNTER — Telehealth: Payer: Self-pay | Admitting: Internal Medicine

## 2015-11-29 NOTE — Telephone Encounter (Signed)
LMTCB x1  We do not have samples of singulair.

## 2015-11-29 NOTE — Telephone Encounter (Signed)
Pt returned call and I let him know that we don't have samples of singulair and he was fine with that.Hillery Hunter

## 2016-01-29 IMAGING — CT CT CHEST W/O CM
2 of 3 series · 15 of 36 positions shown, 18 images · non-contrast
Comparison: Chest x-ray on 09/04/2014

CLINICAL DATA: Abnormal cxr:Question prominent fat pad at RIGHT
cardiophrenic angle, though mass is not entirely excluded. Per
Radiologist recommendation.

EXAM:
CT CHEST WITHOUT CONTRAST
TECHNIQUE: Multidetector CT imaging of the chest was performed following the
standard protocol without IV contrast..

[Series 201: chest without, idose (2) · axial · non-contrast · 0.66mm/px · z∈[-363,-93]mm · 12 of 64 slices shown, 15 images]
[im 5/64  mediastinal]
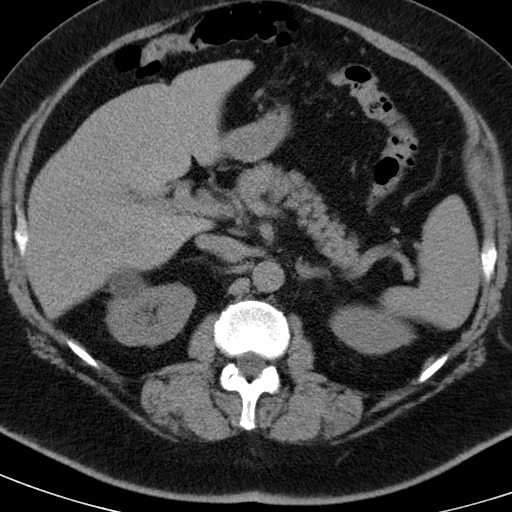
[im 5/64  lung]
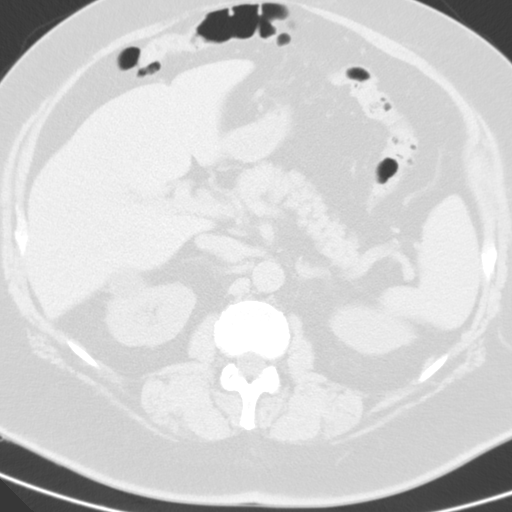
[im 10/64  lung]
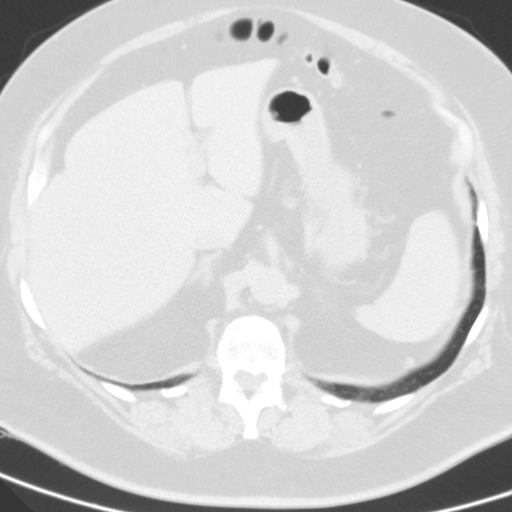
[im 15/64  lung]
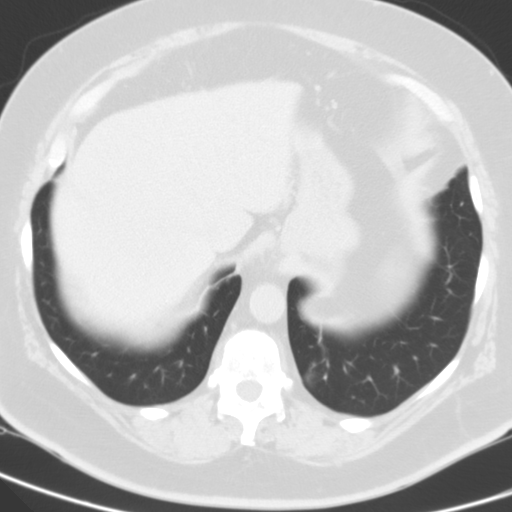
[im 19/64  lung]
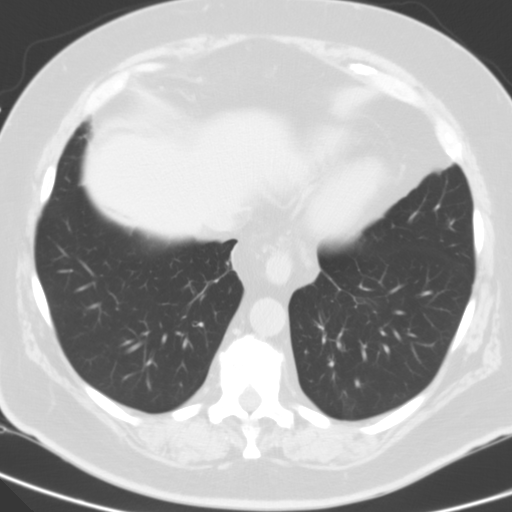
[im 24/64  mediastinal]
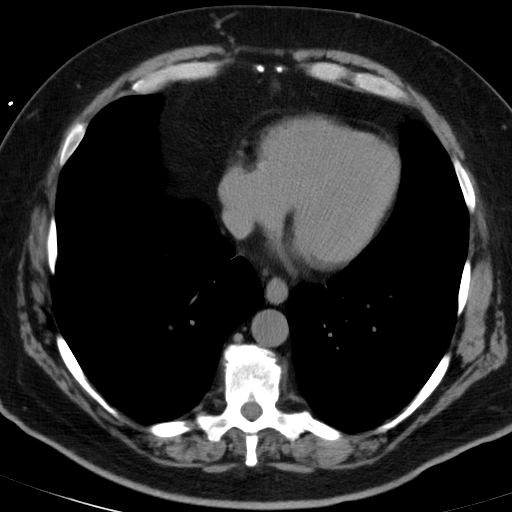
[im 24/64  lung]
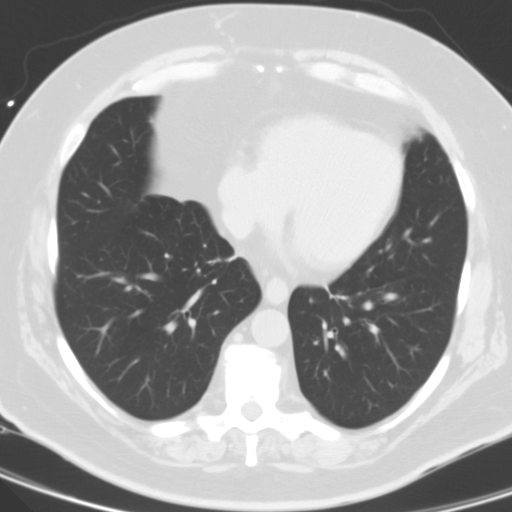
[im 29/64  lung]
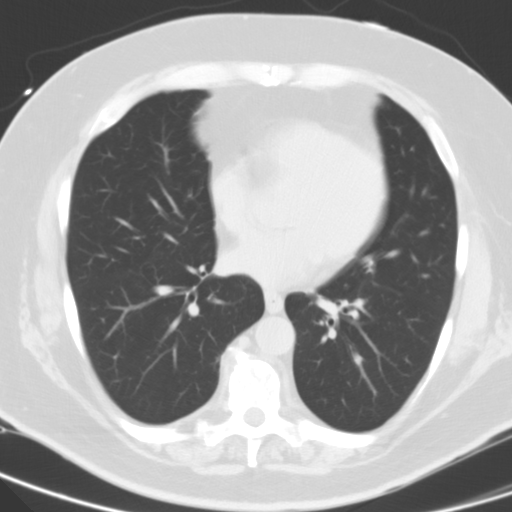
[im 36/64  lung]
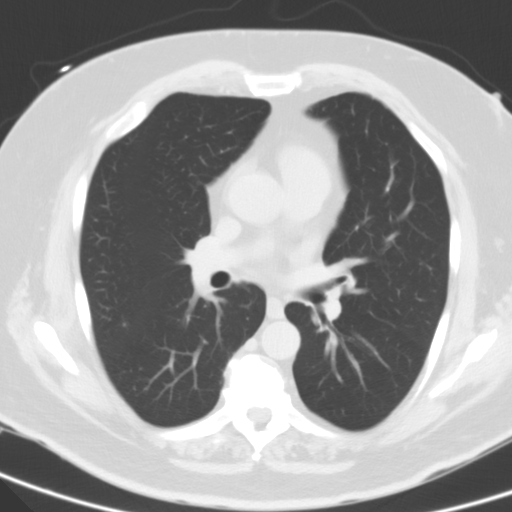
[im 40/64  lung]
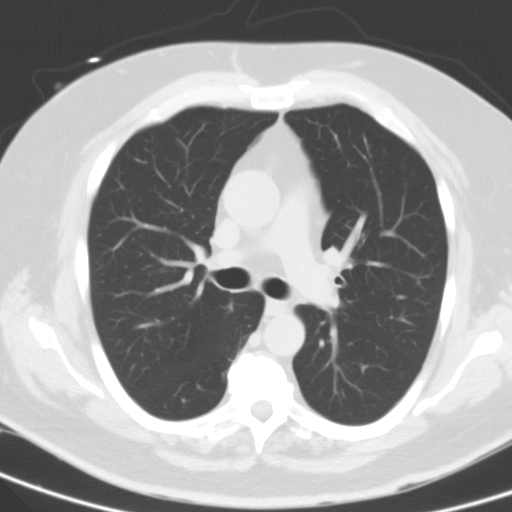
[im 45/64  mediastinal]
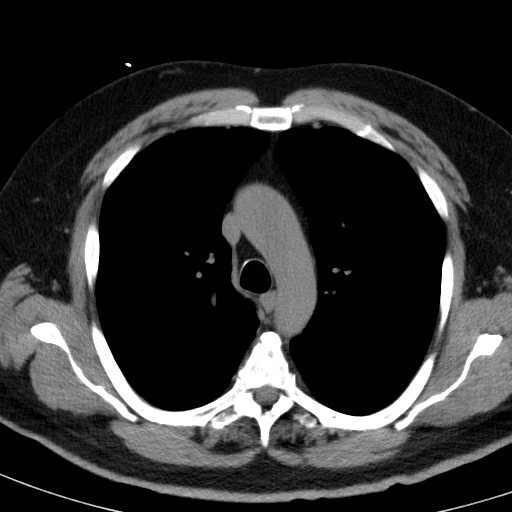
[im 45/64  lung]
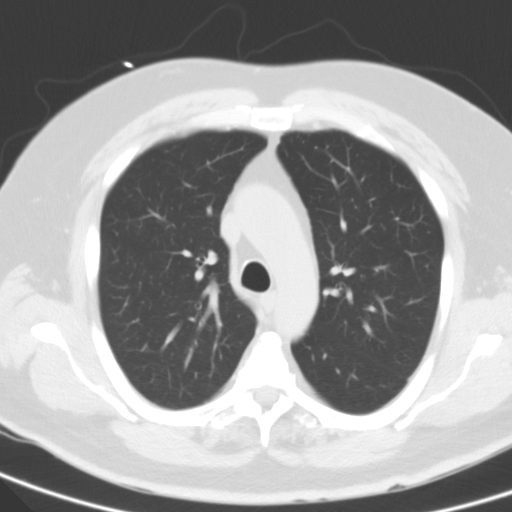
[im 50/64  lung]
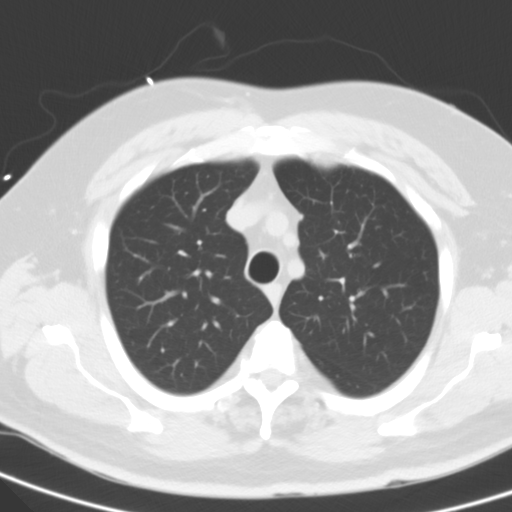
[im 54/64  lung]
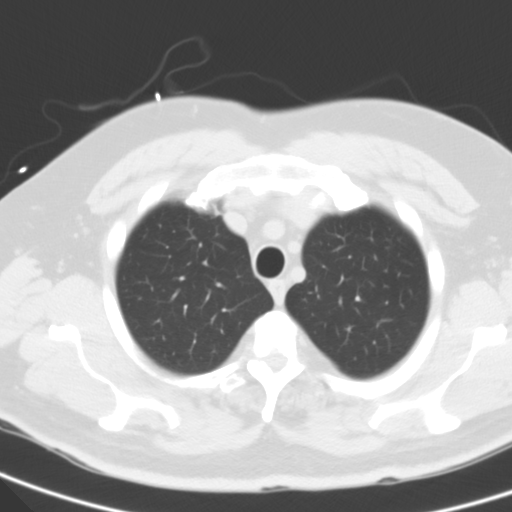
[im 59/64  lung]
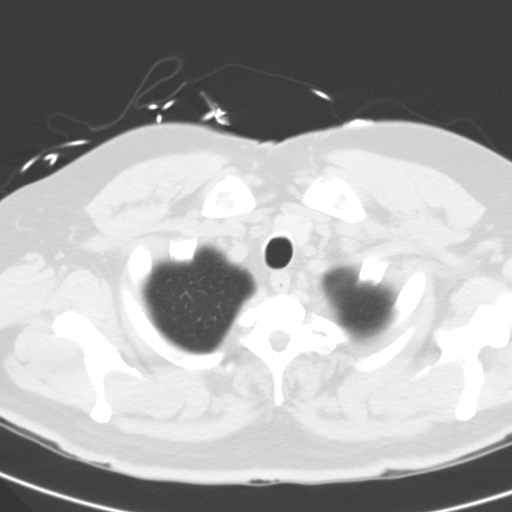

[Series 202: coronal, idose (2) · coronal · 0.50mm/px · 3 of 135 slices shown]
[im 27/135  lung]
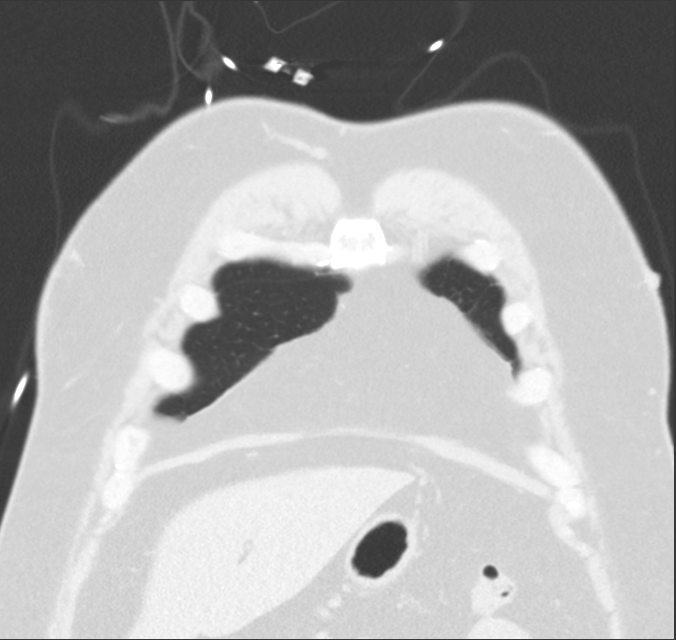
[im 54/135  lung]
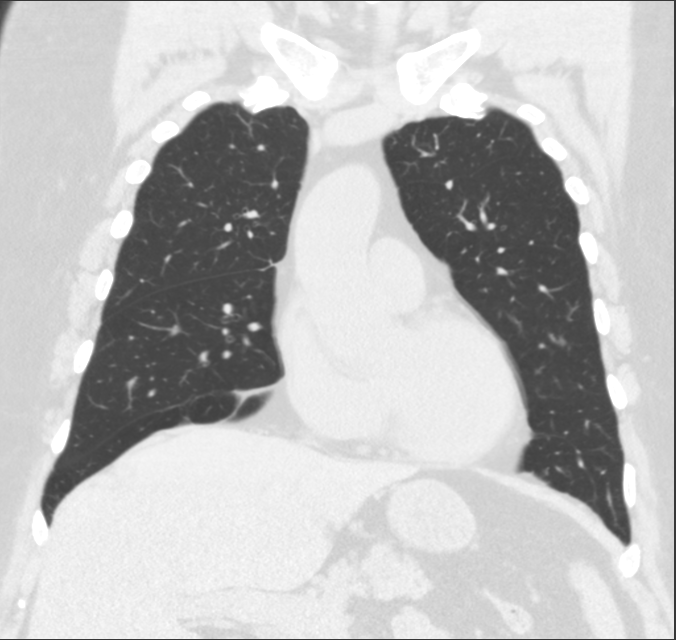
[im 81/135  lung]
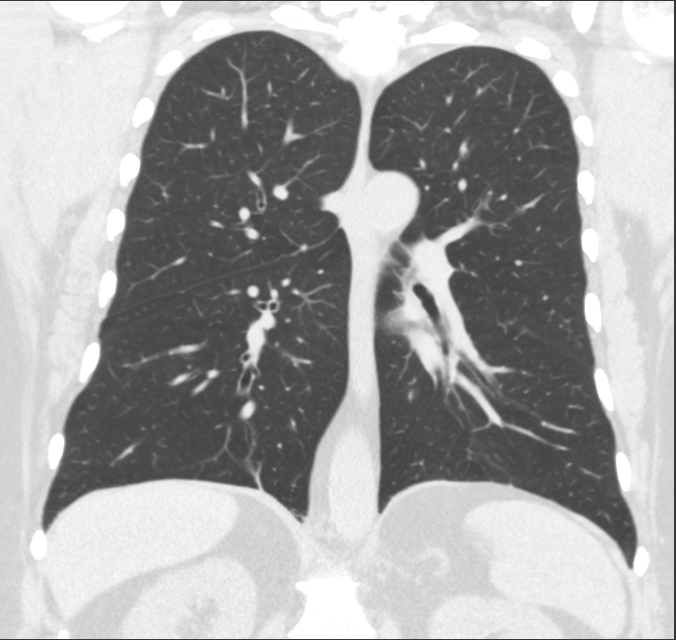

[15 of 36 positions shown; findings below may reference images not displayed]

FINDINGS: Heart: Heart size is normal. No pericardial effusion or significant
coronary artery calcification.

Vascular structures: Normal arch anatomy.  No evidence for aneurysm.

Mediastinum/thyroid: No mediastinal, hilar, or axillary adenopathy.
The visualized portion of the thyroid gland has a normal appearance.
A prominent right pericardial fat pad accounts for the chest x-ray
abnormality.

Lungs/Airways: No pulmonary nodules, pleural effusions, or
infiltrates are identified.

Upper abdomen: Right renal cyst is identified. The gallbladder is
present. The adrenal glands have a normal appearance.

Chest wall/osseous structures: Negative.
IMPRESSION: 1. Abnormality on recent chest x-ray is a prominent pericardial fat
pad, benign.
2.  No evidence for acute  abnormality.

## 2016-11-25 ENCOUNTER — Telehealth: Payer: Self-pay | Admitting: Internal Medicine

## 2016-11-25 NOTE — Telephone Encounter (Signed)
lmomtcb x1 

## 2016-11-26 NOTE — Telephone Encounter (Signed)
lmtcb x2 for pt. 

## 2016-11-27 NOTE — Telephone Encounter (Signed)
lmtcb x1 for pt. 

## 2016-11-27 NOTE — Telephone Encounter (Signed)
Patient returned call.  Call was disconnected after trying to reach triage.

## 2016-11-27 NOTE — Telephone Encounter (Signed)
Pt returning call again.Charles Mosley ° °

## 2016-11-27 NOTE — Telephone Encounter (Signed)
lmtcb x3 for pt. 

## 2016-11-27 NOTE — Telephone Encounter (Signed)
lmtcb

## 2016-12-01 NOTE — Telephone Encounter (Signed)
Lmtcb.  Pt has not been seen since 2016, will need an office visit for refills.

## 2016-12-02 ENCOUNTER — Telehealth: Payer: Self-pay | Admitting: Internal Medicine

## 2016-12-02 NOTE — Telephone Encounter (Signed)
lmtcb x3 for pt. Per triage protocol, message will be closed.  

## 2016-12-02 NOTE — Telephone Encounter (Signed)
Spoke with pt. Advised him that unfortunately we can't refill his medications at this time. We have not seen the pt in over 1.5 years. He would need an appointment. Pt verbalized understanding. Nothing further was needed.

## 2016-12-25 ENCOUNTER — Ambulatory Visit (INDEPENDENT_AMBULATORY_CARE_PROVIDER_SITE_OTHER): Payer: Self-pay | Admitting: Internal Medicine

## 2016-12-25 ENCOUNTER — Encounter: Payer: Self-pay | Admitting: Internal Medicine

## 2016-12-25 DIAGNOSIS — J4541 Moderate persistent asthma with (acute) exacerbation: Secondary | ICD-10-CM

## 2016-12-25 MED ORDER — FLUTICASONE-SALMETEROL 250-50 MCG/DOSE IN AEPB: 1.0000 | INHALATION_SPRAY | Freq: Two times a day (BID) | RESPIRATORY_TRACT | 11 refills | Status: DC

## 2016-12-25 MED ORDER — MONTELUKAST SODIUM 10 MG PO TABS: 10.0000 mg | ORAL_TABLET | Freq: Every evening | ORAL | 3 refills | Status: DC

## 2016-12-25 MED ORDER — PREDNISONE 10 MG PO TABS: ORAL_TABLET | ORAL | 0 refills | Status: DC

## 2016-12-25 NOTE — Progress Notes (Signed)
Subjective:     Patient ID: Charles Mosley, male   DOB: 06/03/1956, 61 y.o.   MRN: [id number]  HPI    IOV 02/13/2015  Chief Complaint  Patient presents with  . :Pulmonary Consult    Self Referral - Pt moved here from [place] and needs to establish with new pulmonary md -Asthma with some copd - Asthma worsened 3 years ago - Occas sob after eating or with increased stress - Denies cough or wheezing  - Pt also uses cpap    61 year-old morbidly obese male. Used to work for [employer] post and [place] today. 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 Wife also relocated here to work for [employer]. He reports a diagnosis of asthma made over 10 years ago. He's been on Advair which she takes only 1 puff at night along with Singulair for the last few years. His last visit with pulmonologist Dr. Meredith Pel in [place] was he says over a year ago. Last prednisone was over a year ago. Last permit function tests and chest x-ray was over a year ago. Limited outside records from Dr. Meredith Pel shows that he had a clear chest x-ray in December 2014. He was also subjected to methacholine challenge test per the results are known. Patient himself does not recollect any of these test results. Currently feels his asthma is extremely well controlled. His last albuterol use was several months ago. On the 5. asthma control test score was 22 showing good control  Specifically in the last 4 weeks he does not feel his asthma is preventing him from getting any of his work done. He admits to shortness of breath 3-6 times a week in the last 4 weeks. But he does not have any other asthma symptoms at this wheezing coughing chest tightness awaking up at night. Also in the last 4 weeks is not uses albuterol for rescue. Overall he feels his asthma is well controlled.  He does have chronic sleep apnea and uses CPAP but he does not have a sleep Dr.  Marcelline Deist main purpose of  this visit today is to establish care in [facility] and get refills  His exhaled nitric oxide FeNO is 24 ppb and shows good control of asthma   OV 12/25/2016  Chief Complaint  Patient presents with  . Follow-up    Pt states overall his breahting feels slightly worse since last OV due to being off medication, pt last seen in 01/2015. Pt denies significant cough and CP/tightness.       61 year old male last seen in April 2016. Since then he says that he lost his job in [employer] and recommended and therefore his health insurance. He now works at [employer] on a part-time basis. He does not have health insurance. Therefore is not taking any inhaled corticosteroids. He only has Singulair the 90s while at I gave him. He says he is able to get it at 3 of cost but still makes it stretch for a whole year. Taking the Singulair only every few to several days. He says on the days he takes Singulair he feels well but has not been feeling poorly. Currently he is reporting lot more symptoms as documented below the asthma control questionnaire. This is one of the good days because he took Singulair last night. This past week is woken up at least a few times at night because of asthma. When he wakes up he feels he has  moderate symptoms. He is not limited in his activities because of asthma. He has a moderate amount of shortness of breath because of asthma and is wheezing a little of the time and using albuterol for rescue at least 3 or 4 times. He still has some difficulty understanding the difference between maintenance inhalers and as rescue inhalers. He is not sure why despite getting the Singulair free of cost is only taking it every few to several days. He's not sure how much of an inhaler would cost him .   no active chest pain or sputum or fever or chills  Asthma Control Panel 02/13/2015  12/25/2016   Current Med Regimen advair at night + singulair at night singulair every few days and alb prn  ACQ  5 point- 1 week (wtd avg score) x 2  ACQ 7 point - 1 week (wtd avg score) x x  ACT - 4 week (total score, <19= symptoms) 22 x  FeNO ppB 24 ppb on 02/13/2015  61ppb  FeV1  x x  Planned intervention  for visit cotninue advair/singulair - no change in baseline regime        has a past medical history of Asthma; COPD (chronic obstructive pulmonary disease) ([place]); Coronary artery disease; Hypertension; Nephritis; OSA on CPAP; and Skin cancer.   reports that he has been smoking Cigars.  He has quit using smokeless tobacco.  Past Surgical History:  Procedure Laterality Date  . CORONARY ANGIOPLASTY  2012  . SKIN BIOPSY      No Known Allergies  Immunization History  Administered Date(s) Administered  . Influenza Split 07/27/2014  . Influenza,inj,Quad PF,36+ Mos 07/27/2016  . Pneumococcal-Unspecified 10/27/2012    Family History  Problem Relation Age of Onset  . Cancer Mother   . Emphysema Father      Current Outpatient Prescriptions:  .  albuterol (PROVENTIL HFA;VENTOLIN HFA) 108 (90 Base) MCG/ACT inhaler, Inhale 1 puff into the lungs every 6 (six) hours as needed for wheezing or shortness of breath., Disp: 3 Inhaler, Rfl: 0 .  aspirin EC 81 MG tablet, Take 81 mg by mouth every evening., Disp: , Rfl:  .  fexofenadine (ALLEGRA) 60 MG tablet, Take 60 mg by mouth daily., Disp: , Rfl:  .  montelukast (SINGULAIR) 10 MG tablet, Take 1 tablet (10 mg total) by mouth every evening., Disp: 90 tablet, Rfl: 0 .  BYSTOLIC 2.5 MG tablet, Take 2.5 mg by mouth daily., Disp: , Rfl:  .  docusate sodium (COLACE) 100 MG capsule, Take 100 mg by mouth daily as needed for mild constipation., Disp: , Rfl:  .  Fluticasone-Salmeterol (ADVAIR) 250-50 MCG/DOSE AEPB, Inhale 1 puff into the lungs 2 (two) times daily. (Patient not taking: Reported on 12/25/2016), Disp: 60 each, Rfl: 0 .  valsartan (DIOVAN) 160 MG tablet, Take 160 mg by mouth daily., Disp: , Rfl:    Review of Systems     Objective:    Physical Exam  Constitutional: He is oriented to person, place, and time. He appears well-developed and well-nourished. No distress.  Has lost weight  HENT:  Head: Normocephalic and atraumatic.  Right Ear: External ear normal.  Left Ear: External ear normal.  Mouth/Throat: Oropharynx is clear and moist. No oropharyngeal exudate.  Eyes: Conjunctivae and EOM are normal. Pupils are equal, round, and reactive to light. Right eye exhibits no discharge. Left eye exhibits no discharge. No scleral icterus.  Neck: Normal range of motion. Neck supple. No JVD present. No tracheal deviation present. No  thyromegaly present.  Cardiovascular: Normal rate, regular rhythm and intact distal pulses.  Exam reveals no gallop and no friction rub.   No murmur heard. Pulmonary/Chest: Effort normal and breath sounds normal. No respiratory distress. He has no wheezes. He has no rales. He exhibits no tenderness.  Abdominal: Soft. Bowel sounds are normal. He exhibits no distension and no mass. There is no tenderness. There is no rebound and no guarding.  Musculoskeletal: Normal range of motion. He exhibits no edema or tenderness.  Lymphadenopathy:    He has no cervical adenopathy.  Neurological: He is alert and oriented to person, place, and time. He has normal reflexes. No cranial nerve deficit. Coordination normal.  Skin: Skin is warm and dry. No rash noted. He is not diaphoretic. No erythema. No pallor.  Psychiatric: He has a normal mood and affect. His behavior is normal. Judgment and thought content normal.  Nursing note and vitals reviewed.  Vitals:   12/25/16 1136  BP: 128/90  Pulse: 75  SpO2: 97%  Weight: 200 lb (90.7 kg)  Height: 5\' 6"  (1.676 m)   Estimated body mass index is 32.28 kg/m as calculated from the following:   Height as of this encounter: 5\' 6"  (1.676 m).   Weight as of this encounter: 200 lb (90.7 kg).     Assessment:       ICD-9-CM ICD-10-CM   1. Moderate persistent asthma with  acute exacerbation 493.92 J45.41        Plan:     Moderate persistent asthma with acute exacerbation You are in active asthma due to lack of daily maintenance therapy which in turn is due to $ issues  Plan  - Take prednisone 40 mg daily x 2 days, then 20mg  daily x 2 days, then 10mg  daily x 2 days, then 5mg  daily x 2 days and stop - instead of daily singulair - price out airduo or advair or breo or dulera or symbicort inhalers  Which you should take daily (take some samples for now)  - if these are more expensive atleast take singulair daily - use albuterol only as needed   Followup 3 months or sooner if needed    Dr. Brand Males, M.D., [facility].C.P Pulmonary and Critical Care Medicine Staff Physician [facility] Pager: [phone], If no answer or between  15:00h - 7:00h: call [phone]  12/25/2016 12:13 PM

## 2016-12-25 NOTE — Patient Instructions (Signed)
Moderate persistent asthma with acute exacerbation You are in active asthma due to lack of daily maintenance therapy which in turn is due to $ issues  Plan  - Take prednisone 40 mg daily x 2 days, then 20mg  daily x 2 days, then 10mg  daily x 2 days, then 5mg  daily x 2 days and stop - instead of daily singulair - price out airduo or advair or breo or dulera or symbicort inhalers  Which you should take daily (take some samples for now)  - if these are more expensive atleast take singulair daily - use albuterol only as needed   Followup 3 months or sooner if needed

## 2016-12-25 NOTE — Assessment & Plan Note (Signed)
You are in active asthma due to lack of daily maintenance therapy which in turn is due to $ issues  Plan  - Take prednisone 40 mg daily x 2 days, then 20mg  daily x 2 days, then 10mg  daily x 2 days, then 5mg  daily x 2 days and stop - instead of daily singulair - price out airduo or advair or breo or dulera or symbicort inhalers  Which you should take daily (take some samples for now)  - if these are more expensive atleast take singulair daily - use albuterol only as needed   Followup 3 months or sooner if needed

## 2017-01-21 ENCOUNTER — Other Ambulatory Visit: Payer: Self-pay | Admitting: Internal Medicine

## 2017-01-21 NOTE — Telephone Encounter (Signed)
ATC, line rang several time, no voicemail.  WCB

## 2017-01-22 ENCOUNTER — Telehealth: Payer: Self-pay | Admitting: Internal Medicine

## 2017-01-22 NOTE — Telephone Encounter (Signed)
Pt returning call.Charles Mosley ° °

## 2017-01-22 NOTE — Telephone Encounter (Signed)
Per yesterday's refill encounter - Patient called states he uses [facility] for meds - they don't have Advair - but MR said ok to change to [facility] if necessary - pt would like rx for [facility] to be sent to [facility] - fax # [phone]    [facility] for pt.

## 2017-01-22 NOTE — Telephone Encounter (Signed)
Called and spoke to pt. Pt is requesting Dulera (90 day) to be faxed to [facility] at [phone].   MR please advise what strength. Thanks.

## 2017-01-22 NOTE — Telephone Encounter (Signed)
Please see phone note from today 01/22/17 Will sign off on this encounter as this is a refill encounter and more difficult to follow.

## 2017-01-22 NOTE — Telephone Encounter (Signed)
lmtcb for pt.  

## 2017-01-27 MED ORDER — MOMETASONE FURO-FORMOTEROL FUM 200-5 MCG/ACT IN AERO: 2.0000 | INHALATION_SPRAY | Freq: Two times a day (BID) | RESPIRATORY_TRACT | 3 refills | Status: DC

## 2017-01-27 NOTE — Telephone Encounter (Signed)
Fax number was incorrect.  I have spoken with [facility] who states med can be e-prescribed. Ruthe Mannan has been sent to preferred pharmacy.  Lm to make pt aware. Nothing further needed.

## 2017-01-27 NOTE — Telephone Encounter (Signed)
Do higher dose 200/4,5 2 puff bid of dulera  Dr. Brand Males, M.D., [facility].C.P Pulmonary and Critical Care Medicine Staff Physician [place] [facility] Pager: [phone], If no answer or between  15:00h - 7:00h: call [phone]  01/27/2017 6:39 AM

## 2017-02-23 ENCOUNTER — Other Ambulatory Visit: Payer: Self-pay | Admitting: Family Medicine

## 2017-02-23 DIAGNOSIS — R1030 Lower abdominal pain, unspecified: Secondary | ICD-10-CM

## 2017-03-16 ENCOUNTER — Other Ambulatory Visit: Payer: Self-pay

## 2017-03-27 ENCOUNTER — Ambulatory Visit: Payer: Self-pay | Admitting: Internal Medicine

## 2017-06-04 ENCOUNTER — Ambulatory Visit (INDEPENDENT_AMBULATORY_CARE_PROVIDER_SITE_OTHER): Payer: Self-pay | Admitting: Internal Medicine

## 2017-06-04 ENCOUNTER — Encounter: Payer: Self-pay | Admitting: Internal Medicine

## 2017-06-04 VITALS — BP 128/80 | HR 73 | Ht 66.0 in | Wt 193.0 lb

## 2017-06-04 DIAGNOSIS — J45909 Unspecified asthma, uncomplicated: Secondary | ICD-10-CM | POA: Insufficient documentation

## 2017-06-04 DIAGNOSIS — J4540 Moderate persistent asthma, uncomplicated: Secondary | ICD-10-CM

## 2017-06-04 MED ORDER — MOMETASONE FURO-FORMOTEROL FUM 200-5 MCG/ACT IN AERO: 2.0000 | INHALATION_SPRAY | Freq: Two times a day (BID) | RESPIRATORY_TRACT | 3 refills | Status: DC

## 2017-06-04 NOTE — Addendum Note (Signed)
Addended by: Mathis Bud on: 06/04/2017 10:27 AM   Modules accepted: Orders

## 2017-06-04 NOTE — Patient Instructions (Signed)
ICD-10-CM   1. Asthma, moderate persistent, well-controlled J45.40     Well controlled asthma  Plan Continue dulera scheduled - will do refill Use albuterol as needed No need for singulair  Flu shot in fall 2018 Once you get insurance recommend Prevnar vaccine and new shingles vaccine  Followup 9 months or sooner if needed

## 2017-06-04 NOTE — Progress Notes (Signed)
Subjective:     Patient ID: Charles Mosley, male   DOB: [birth year], 61 y.o.   MRN: [id number]  HPI  IOV 02/13/2015  Chief Complaint  Patient presents with  . :Pulmonary Consult    Self Referral - Pt moved here from [place] and needs to establish with new pulmonary md -Asthma with some copd - Asthma worsened 3 years ago - Occas sob after eating or with increased stress - Denies cough or wheezing  - Pt also uses cpap    61 year-old morbidly obese male. Used to work for [employer] post and [place] today. 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 Wife also relocated here to work for [employer]. He reports a diagnosis of asthma made over 10 years ago. He's been on Advair which she takes only 1 puff at night along with Singulair for the last few years. His last visit with pulmonologist Dr. Meredith Pel in [place] was he says over a year ago. Last prednisone was over a year ago. Last permit function tests and chest x-ray was over a year ago. Limited outside records from Dr. Meredith Pel shows that he had a clear chest x-ray in December 2014. He was also subjected to methacholine challenge test per the results are known. Patient himself does not recollect any of these test results. Currently feels his asthma is extremely well controlled. His last albuterol use was several months ago. On the 5. asthma control test score was 22 showing good control  Specifically in the last 4 weeks he does not feel his asthma is preventing him from getting any of his work done. He admits to shortness of breath 3-6 times a week in the last 4 weeks. But he does not have any other asthma symptoms at this wheezing coughing chest tightness awaking up at night. Also in the last 4 weeks is not uses albuterol for rescue. Overall he feels his asthma is well controlled.  He does have chronic sleep apnea and uses CPAP but he does not have a sleep Dr.  Marcelline Deist main purpose of this  visit today is to establish care in [facility] and get refills  His exhaled nitric oxide FeNO is 24 ppb and shows good control of asthma   OV 12/25/2016  Chief Complaint  Patient presents with  . Follow-up    Pt states overall his breahting feels slightly worse since last OV due to being off medication, pt last seen in 01/2015. Pt denies significant cough and CP/tightness.       61 year old male last seen in April 2016. Since then he says that he lost his job in [employer] and recommended and therefore his health insurance. He now works at [employer] on a part-time basis. He does not have health insurance. Therefore is not taking any inhaled corticosteroids. He only has Singulair the 90s while at I gave him. He says he is able to get it at 3 of cost but still makes it stretch for a whole year. Taking the Singulair only every few to several days. He says on the days he takes Singulair he feels well but has not been feeling poorly. Currently he is reporting lot more symptoms as documented below the asthma control questionnaire. This is one of the good days because he took Singulair last night. This past week is woken up at least a few times at night because of asthma. When he wakes up he feels he has moderate symptoms.  He is not limited in his activities because of asthma. He has a moderate amount of shortness of breath because of asthma and is wheezing a little of the time and using albuterol for rescue at least 3 or 4 times. He still has some difficulty understanding the difference between maintenance inhalers and as rescue inhalers. He is not sure why despite getting the Singulair free of cost is only taking it every few to several days. He's not sure how much of an inhaler would cost him .   no active chest pain or sputum or fever or chills   OV 06/04/2017  Chief Complaint  Patient presents with  . Follow-up    Pt states his breathing is doing well. Pt denies cough, CP/tightness, and  f/c/s.    61 year old male with clinical moderate persistent asthma. He returns for follow-up. He still works at [employer]. He still does not have insurance. He is going to start a new job and he'll start having health insurance in a month. To 6 months. At this point in time he is not on Singulair. He only takes Dulera 2 puffs 2 times daily. He gets this med cost program for free. He says that asthma is well controlled. There is no albuterol use or chest pain or chest tightness. In fact is 5. asthma control questionnaire is 0. He is not waking up in the night with asthma symptoms. When he wakes up he has no problems. This no shortness of breath or wheezing or albuterol rescue use. He feels good.    Asthma Control Panel 02/13/2015  12/25/2016  06/04/2017   Current Med Regimen advair at night + singulair at night singulair every few days and alb prn Dulera. No singulare  ACQ 5 point- 1 week (wtd avg score) x 2 0  FeNO ppB 24 ppb on 02/13/2015  61ppb x  FeV1  x x x  Planned intervention  for visit cotninue advair/singulair - no change in baseline regime  dulera continue      has a past medical history of Asthma; COPD (chronic obstructive pulmonary disease) ([place]); Coronary artery disease; Hypertension; Nephritis; OSA on CPAP; and Skin cancer.   reports that he has been smoking Cigars.  He has quit using smokeless tobacco.  Past Surgical History:  Procedure Laterality Date  . CORONARY ANGIOPLASTY  2012  . SKIN BIOPSY      No Known Allergies  Immunization History  Administered Date(s) Administered  . Influenza Split 07/27/2014  . Influenza,inj,Quad PF,36+ Mos 07/27/2016  . Pneumococcal-Unspecified 10/27/2012    Family History  Problem Relation Age of Onset  . Cancer Mother   . Emphysema Father      Current Outpatient Prescriptions:  .  albuterol (PROVENTIL HFA;VENTOLIN HFA) 108 (90 Base) MCG/ACT inhaler, Inhale 1 puff into the lungs every 6 (six) hours as needed for wheezing  or shortness of breath., Disp: 3 Inhaler, Rfl: 0 .  aspirin EC 81 MG tablet, Take 81 mg by mouth every evening., Disp: , Rfl:  .  fexofenadine (ALLEGRA) 60 MG tablet, Take 60 mg by mouth daily., Disp: , Rfl:  .  mometasone-formoterol (DULERA) 200-5 MCG/ACT AERO, Inhale 2 puffs into the lungs 2 (two) times daily., Disp: 3 Inhaler, Rfl: 3 .  valsartan (DIOVAN) 160 MG tablet, Take 160 mg by mouth daily., Disp: , Rfl:    Review of Systems     Objective:   Physical Exam  Constitutional: He is oriented to person, place, and time. He  appears well-developed and well-nourished. No distress.  HENT:  Head: Normocephalic and atraumatic.  Right Ear: External ear normal.  Left Ear: External ear normal.  Mouth/Throat: Oropharynx is clear and moist. No oropharyngeal exudate.  Eyes: Pupils are equal, round, and reactive to light. Conjunctivae and EOM are normal. Right eye exhibits no discharge. Left eye exhibits no discharge. No scleral icterus.  Neck: Normal range of motion. Neck supple. No JVD present. No tracheal deviation present. No thyromegaly present.  Cardiovascular: Normal rate, regular rhythm and intact distal pulses.  Exam reveals no gallop and no friction rub.   No murmur heard. Pulmonary/Chest: Effort normal and breath sounds normal. No respiratory distress. He has no wheezes. He has no rales. He exhibits no tenderness.  Abdominal: Soft. Bowel sounds are normal. He exhibits no distension and no mass. There is no tenderness. There is no rebound and no guarding.  Musculoskeletal: Normal range of motion. He exhibits no edema or tenderness.  Lymphadenopathy:    He has no cervical adenopathy.  Neurological: He is alert and oriented to person, place, and time. He has normal reflexes. No cranial nerve deficit. Coordination normal.  Skin: Skin is warm and dry. No rash noted. He is not diaphoretic. No erythema. No pallor.  Psychiatric: He has a normal mood and affect. His behavior is normal. Judgment  and thought content normal.  Nursing note and vitals reviewed.   Vitals:   06/04/17 0943  BP: 128/80  Pulse: 73  SpO2: 97%  Weight: 193 lb (87.5 kg)  Height: 5\' 6"  (1.676 m)   Estimated body mass index is 31.15 kg/m as calculated from the following:   Height as of this encounter: 5\' 6"  (1.676 m).   Weight as of this encounter: 193 lb (87.5 kg).     Assessment:       ICD-10-CM   1. Asthma, moderate persistent, well-controlled J45.40        Plan:      Well controlled asthma  Plan Continue dulera scheduled - will do refill Use albuterol as needed No need for singulair  Flu shot in fall 2018 Once you get insurance recommend Prevnar vaccine and new shingles vaccine  Followup 9 months or sooner if needed    Dr. Brand Males, M.D., [facility].C.P Pulmonary and Critical Care Medicine Staff Physician [place] [facility] Pager: [phone], If no answer or between  15:00h - 7:00h: call [phone]  06/04/2017 10:22 AM

## 2017-09-02 ENCOUNTER — Ambulatory Visit: Payer: Self-pay | Admitting: Surgery

## 2017-09-02 NOTE — H&P (Signed)
Surgical H&P CC: hernia  HPI: this is a very nice 60 year old gentleman who presents with about a one-year history of a large left inguinal hernia.  It does not really cause him pain but he does have some discomfort with certain activities such as bending or lifting.  No issues of constipation or abdominal bloating or nausea.  No urinary symptoms. He would like to have repaired.  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  He intermittently works shifts at [employer] which require long amounts of standing and some heavy lifting.  He smokes a cigar on rare occasions.  No Known Allergies  Past Medical History:  Diagnosis Date  . Asthma   . COPD (chronic obstructive pulmonary disease) ([place])   . Coronary artery disease   . Hypertension   . Nephritis   . OSA on CPAP   . Skin cancer     Past Surgical History:  Procedure Laterality Date  . CORONARY ANGIOPLASTY  2012  . SKIN BIOPSY      Family History  Problem Relation Age of Onset  . Cancer Mother   . Emphysema Father     Social History   Socioeconomic History  . Marital status: Married    Spouse name: Not on file  . Number of children: Not on file  . Years of education: Not on file  . Highest education level: Not on file  Social Needs  . Financial resource strain: Not on file  . Food insecurity - worry: Not on file  . Food insecurity - inability: Not on file  . Transportation needs - medical: Not on file  . Transportation needs - non-medical: Not on file  Occupational History  . Occupation: Unemployed   Tobacco Use  . Smoking status: Light Tobacco Smoker    Types: Cigars  . Smokeless tobacco: Former Systems developer  . Tobacco comment: only has had 1 cigar in the past year 3.1.2018  Substance and Sexual Activity  . Alcohol use: Yes    Comment: weekends  . Drug use: No  . Sexual activity: Not on file  Other Topics Concern  . Not on file  Social History Narrative  . Not on file    Current  Outpatient Medications on File Prior to Visit  Medication Sig Dispense Refill  . albuterol (PROVENTIL HFA;VENTOLIN HFA) 108 (90 Base) MCG/ACT inhaler Inhale 1 puff into the lungs every 6 (six) hours as needed for wheezing or shortness of breath. 3 Inhaler 0  . aspirin EC 81 MG tablet Take 81 mg by mouth every evening.    . fexofenadine (ALLEGRA) 60 MG tablet Take 60 mg by mouth daily.    . mometasone-formoterol (DULERA) 200-5 MCG/ACT AERO Inhale 2 puffs into the lungs 2 (two) times daily. 3 Inhaler 3  . valsartan (DIOVAN) 160 MG tablet Take 160 mg by mouth daily.     No current facility-administered medications on file prior to visit.     Review of Systems: a complete, 10pt review of systems was completed with pertinent positives and negatives as documented in the HPI  Physical Exam: There were no vitals filed for this visit. Gen: A&Ox3, no distress  Head: normocephalic, atraumatic, EOMI, anicteric.  Neck: supple without mass or thyromegaly Chest: unlabored respirations, symmetrical air entry   Cardiovascular: RRR with palpable distal pulses, no pedal edema Abdomen: soft, nondistended, nontender. There is a partially reducible large left femoral hernia.  He has a well-healed midline/Pfannenstiel-type T-shaped scar from a prior abdominoplasty. Extremities:  warm, without edema, no deformities  Neuro: grossly intact Psych: appropriate mood and affect  Skin: warm and dry   CBC Latest Ref Rng & Units 09/04/2014  WBC 4.0 - 10.5 K/uL 6.4  Hemoglobin 13.0 - 17.0 g/dL 15.9  Hematocrit 39.0 - 52.0 % 46.0  Platelets 150 - 400 K/uL 214    CMP Latest Ref Rng & Units 09/04/2014  Glucose 70 - 99 mg/dL 105(H)  BUN 6 - 23 mg/dL 12  Creatinine 0.50 - 1.35 mg/dL 0.88  Sodium 137 - 147 mEq/L 137  Potassium 3.7 - 5.3 mEq/L 4.6  Chloride 96 - 112 mEq/L 98  CO2 19 - 32 mEq/L 24  Calcium 8.4 - 10.5 mg/dL 9.2    No results found for: INR, PROTIME  Imaging: No results found.    A/P: large  left inguinal hernia.  We discussed open repair with mesh.  Discussed the technique of the surgery, risks f bleeding, infection, pain, scarring, injury to surrounding structures including bowel, spermatic cord, testicle, and adjacent nerves resulting in chronic groin pain.  Discussed risk of hernia recurrence and anticipated postoperative restrictions.  He is understanding.  All his questions were answered.  We'll get him scheduled in the coming weeks.   Romana Juniper, MD [facility], [place] Pager [phone]

## 2017-11-02 ENCOUNTER — Encounter (HOSPITAL_COMMUNITY): Payer: Self-pay

## 2017-11-02 ENCOUNTER — Encounter (HOSPITAL_COMMUNITY)
Admission: RE | Admit: 2017-11-02 | Discharge: 2017-11-02 | Disposition: A | Discharge status: Home / Self Care | Payer: 59 | Source: Ambulatory Visit | Attending: Surgery | Admitting: Surgery

## 2017-11-02 DIAGNOSIS — Z01812 Encounter for preprocedural laboratory examination: Secondary | ICD-10-CM | POA: Diagnosis not present

## 2017-11-02 DIAGNOSIS — Z01810 Encounter for preprocedural cardiovascular examination: Secondary | ICD-10-CM | POA: Insufficient documentation

## 2017-11-02 LAB — CBC WITH DIFFERENTIAL/PLATELET
Basophils Absolute: 0.1 10*3/uL (ref 0.0–0.1)
Basophils Relative: 1 %
EOS ABS: 0.4 10*3/uL (ref 0.0–0.7)
Eosinophils Relative: 6 %
HEMATOCRIT: 46.6 % (ref 39.0–52.0)
HEMOGLOBIN: 15.6 g/dL (ref 13.0–17.0)
LYMPHS PCT: 35 %
Lymphs Abs: 2.2 10*3/uL (ref 0.7–4.0)
MCH: 30.6 pg (ref 26.0–34.0)
MCHC: 33.5 g/dL (ref 30.0–36.0)
MCV: 91.6 fL (ref 78.0–100.0)
Monocytes Absolute: 0.4 10*3/uL (ref 0.1–1.0)
Monocytes Relative: 6 %
NEUTROS ABS: 3.3 10*3/uL (ref 1.7–7.7)
NEUTROS PCT: 52 %
Platelets: 208 10*3/uL (ref 150–400)
RBC: 5.09 MIL/uL (ref 4.22–5.81)
RDW: 12.4 % (ref 11.5–15.5)
WBC: 6.3 10*3/uL (ref 4.0–10.5)

## 2017-11-02 LAB — BASIC METABOLIC PANEL
ANION GAP: 6 (ref 5–15)
BUN: 11 mg/dL (ref 6–20)
CHLORIDE: 107 mmol/L (ref 101–111)
CO2: 25 mmol/L (ref 22–32)
Calcium: 9.1 mg/dL (ref 8.9–10.3)
Creatinine, Ser: 0.73 mg/dL (ref 0.61–1.24)
GFR calc non Af Amer: >60 mL/min (ref >60)
Glucose, Bld: 83 mg/dL (ref 65–99)
POTASSIUM: 3.8 mmol/L (ref 3.5–5.1)
SODIUM: 138 mmol/L (ref 135–145)

## 2017-11-02 MED ORDER — CHLORHEXIDINE GLUCONATE 4 % EX LIQD: 60.0000 mL | Freq: Once | CUTANEOUS | Status: DC

## 2017-11-02 NOTE — Pre-Procedure Instructions (Signed)
    Charles Mosley  11/02/2017      [place], [place] [address] [place] [place] [address] Phone: [phone] Fax: [place] [address], [place] [facility] [place] [address] [place] [address] Phone: [phone] Fax: [phone]  [facility] - [name], [place] [address] Phone: [phone] Fax: [phone]  [facility] - [place], [place] [address] [place] [address] Phone: [phone] Fax: [phone]    Your procedure is scheduled on 11/13/17.  Report to [facility] Admitting at 730 A.M.  Call this number if you have problems the morning of surgery:  [phone]   Remember:  Do not eat food or drink liquids after midnight.  Take these medicines the morning of surgery with A SIP OF WATER --all inhalers   Do not wear jewelry, make-up or nail polish.  Do not wear lotions, powders, or perfumes, or deodorant.  Do not shave 48 hours prior to surgery.  Men may shave face and neck.  Do not bring valuables to the hospital.  [facility] is not responsible for any belongings or valuables.  Contacts, dentures or bridgework may not be worn into surgery.  Leave your suitcase in the car.  After surgery it may be brought to your room.  For patients admitted to the hospital, discharge time will be determined by your treatment team.  Patients discharged the day of surgery will not be allowed to drive home.   Name and phone number of your driver:   Special instructions:  Do not take any aspirin,anti-inflammatories,vitamins,or herbal supplements 5-7 days prior to surgery.  Please read over the following fact sheets that you were given.

## 2017-11-13 ENCOUNTER — Ambulatory Visit (HOSPITAL_COMMUNITY): Payer: 59 | Admitting: Certified Registered Nurse Anesthetist

## 2017-11-13 ENCOUNTER — Ambulatory Visit (HOSPITAL_COMMUNITY)
Admission: RE | Admit: 2017-11-13 | Discharge: 2017-11-13 | Disposition: A | Discharge status: Home / Self Care | Payer: 59 | Source: Ambulatory Visit | Attending: Surgery | Admitting: Surgery

## 2017-11-13 ENCOUNTER — Encounter (HOSPITAL_COMMUNITY): Payer: Self-pay

## 2017-11-13 ENCOUNTER — Encounter (HOSPITAL_COMMUNITY)
Admission: RE | Disposition: A | Discharge status: Home / Self Care | Payer: Self-pay | Source: Ambulatory Visit | Attending: Surgery

## 2017-11-13 DIAGNOSIS — J449 Chronic obstructive pulmonary disease, unspecified: Secondary | ICD-10-CM | POA: Insufficient documentation

## 2017-11-13 DIAGNOSIS — F17290 Nicotine dependence, other tobacco product, uncomplicated: Secondary | ICD-10-CM | POA: Diagnosis not present

## 2017-11-13 DIAGNOSIS — D176 Benign lipomatous neoplasm of spermatic cord: Secondary | ICD-10-CM | POA: Insufficient documentation

## 2017-11-13 DIAGNOSIS — G4733 Obstructive sleep apnea (adult) (pediatric): Secondary | ICD-10-CM | POA: Insufficient documentation

## 2017-11-13 DIAGNOSIS — Z7982 Long term (current) use of aspirin: Secondary | ICD-10-CM | POA: Diagnosis not present

## 2017-11-13 DIAGNOSIS — I2510 Atherosclerotic heart disease of native coronary artery without angina pectoris: Secondary | ICD-10-CM | POA: Insufficient documentation

## 2017-11-13 DIAGNOSIS — K4090 Unilateral inguinal hernia, without obstruction or gangrene, not specified as recurrent: Secondary | ICD-10-CM | POA: Diagnosis present

## 2017-11-13 DIAGNOSIS — I10 Essential (primary) hypertension: Secondary | ICD-10-CM | POA: Insufficient documentation

## 2017-11-13 DIAGNOSIS — Z79899 Other long term (current) drug therapy: Secondary | ICD-10-CM | POA: Insufficient documentation

## 2017-11-13 DIAGNOSIS — Z85828 Personal history of other malignant neoplasm of skin: Secondary | ICD-10-CM | POA: Insufficient documentation

## 2017-11-13 HISTORY — PX: INGUINAL HERNIA REPAIR: SHX194

## 2017-11-13 HISTORY — PX: INSERTION OF MESH: SHX5868

## 2017-11-13 SURGERY — REPAIR, HERNIA, INGUINAL, ADULT
Anesthesia: General | Site: Inguinal | Laterality: Left

## 2017-11-13 MED ORDER — DEXAMETHASONE SODIUM PHOSPHATE 10 MG/ML IJ SOLN
INTRAMUSCULAR | Status: AC
  Filled 2017-11-13: qty 2

## 2017-11-13 MED ORDER — ACETAMINOPHEN 500 MG PO TABS
1000.0000 mg | ORAL_TABLET | ORAL | Status: AC
  Administered 2017-11-13: 1000 mg via ORAL
  Filled 2017-11-13: qty 2

## 2017-11-13 MED ORDER — BUPIVACAINE-EPINEPHRINE 0.5% -1:200000 IJ SOLN
INTRAMUSCULAR | Status: DC | PRN
  Administered 2017-11-13: 30 mL

## 2017-11-13 MED ORDER — BUPIVACAINE-EPINEPHRINE (PF) 0.5% -1:200000 IJ SOLN
INTRAMUSCULAR | Status: DC | PRN
  Administered 2017-11-13: 30 mL

## 2017-11-13 MED ORDER — MIDAZOLAM HCL 2 MG/2ML IJ SOLN
INTRAMUSCULAR | Status: AC
  Administered 2017-11-13: 2 mg via INTRAVENOUS
  Filled 2017-11-13: qty 2

## 2017-11-13 MED ORDER — FENTANYL CITRATE (PF) 100 MCG/2ML IJ SOLN
INTRAMUSCULAR | Status: DC | PRN
  Administered 2017-11-13 (×2): 50 ug via INTRAVENOUS

## 2017-11-13 MED ORDER — OXYCODONE HCL 5 MG PO TABS: 5.0000 mg | ORAL_TABLET | ORAL | Status: DC | PRN

## 2017-11-13 MED ORDER — CELECOXIB 200 MG PO CAPS
200.0000 mg | ORAL_CAPSULE | ORAL | Status: AC
  Administered 2017-11-13: 200 mg via ORAL
  Filled 2017-11-13: qty 1

## 2017-11-13 MED ORDER — SODIUM CHLORIDE 0.9% FLUSH: 3.0000 mL | Freq: Two times a day (BID) | INTRAVENOUS | Status: DC

## 2017-11-13 MED ORDER — SODIUM CHLORIDE 0.9 % IV SOLN: 250.0000 mL | INTRAVENOUS | Status: DC | PRN

## 2017-11-13 MED ORDER — ONDANSETRON HCL 4 MG/2ML IJ SOLN
INTRAMUSCULAR | Status: AC
  Filled 2017-11-13: qty 2

## 2017-11-13 MED ORDER — BUPIVACAINE-EPINEPHRINE (PF) 0.5% -1:200000 IJ SOLN
INTRAMUSCULAR | Status: AC
  Filled 2017-11-13: qty 30

## 2017-11-13 MED ORDER — LACTATED RINGERS IV SOLN
INTRAVENOUS | Status: DC
  Administered 2017-11-13: 08:00:00 via INTRAVENOUS

## 2017-11-13 MED ORDER — SUGAMMADEX SODIUM 200 MG/2ML IV SOLN
INTRAVENOUS | Status: AC
  Filled 2017-11-13: qty 4

## 2017-11-13 MED ORDER — ONDANSETRON HCL 4 MG/2ML IJ SOLN
INTRAMUSCULAR | Status: DC | PRN
  Administered 2017-11-13: 4 mg via INTRAVENOUS

## 2017-11-13 MED ORDER — GABAPENTIN 300 MG PO CAPS
300.0000 mg | ORAL_CAPSULE | ORAL | Status: AC
  Administered 2017-11-13: 300 mg via ORAL
  Filled 2017-11-13: qty 1

## 2017-11-13 MED ORDER — ACETAMINOPHEN 650 MG RE SUPP: 650.0000 mg | RECTAL | Status: DC | PRN

## 2017-11-13 MED ORDER — FENTANYL CITRATE (PF) 100 MCG/2ML IJ SOLN
INTRAMUSCULAR | Status: AC
  Administered 2017-11-13: 50 ug via INTRAVENOUS
  Filled 2017-11-13: qty 2

## 2017-11-13 MED ORDER — DOCUSATE SODIUM 100 MG PO CAPS: 100.0000 mg | ORAL_CAPSULE | Freq: Two times a day (BID) | ORAL | 0 refills | Status: AC

## 2017-11-13 MED ORDER — MIDAZOLAM HCL 5 MG/5ML IJ SOLN
INTRAMUSCULAR | Status: DC | PRN
  Administered 2017-11-13: 1 mg via INTRAVENOUS

## 2017-11-13 MED ORDER — LIDOCAINE 2% (20 MG/ML) 5 ML SYRINGE
INTRAMUSCULAR | Status: AC
  Filled 2017-11-13: qty 10

## 2017-11-13 MED ORDER — MIDAZOLAM HCL 2 MG/2ML IJ SOLN
INTRAMUSCULAR | Status: AC
  Filled 2017-11-13: qty 2

## 2017-11-13 MED ORDER — FENTANYL CITRATE (PF) 100 MCG/2ML IJ SOLN: 25.0000 ug | INTRAMUSCULAR | Status: DC | PRN

## 2017-11-13 MED ORDER — DEXAMETHASONE SODIUM PHOSPHATE 10 MG/ML IJ SOLN
INTRAMUSCULAR | Status: AC
  Filled 2017-11-13: qty 1

## 2017-11-13 MED ORDER — SODIUM CHLORIDE 0.9% FLUSH: 3.0000 mL | INTRAVENOUS | Status: DC | PRN

## 2017-11-13 MED ORDER — LIDOCAINE 2% (20 MG/ML) 5 ML SYRINGE
INTRAMUSCULAR | Status: DC | PRN
  Administered 2017-11-13: 100 mg via INTRAVENOUS

## 2017-11-13 MED ORDER — PROPOFOL 10 MG/ML IV BOLUS
INTRAVENOUS | Status: AC
  Filled 2017-11-13: qty 40

## 2017-11-13 MED ORDER — LIDOCAINE 2% (20 MG/ML) 5 ML SYRINGE
INTRAMUSCULAR | Status: AC
  Filled 2017-11-13: qty 5

## 2017-11-13 MED ORDER — MIDAZOLAM HCL 2 MG/2ML IJ SOLN
2.0000 mg | Freq: Once | INTRAMUSCULAR | Status: AC
  Administered 2017-11-13: 2 mg via INTRAVENOUS

## 2017-11-13 MED ORDER — OXYCODONE-ACETAMINOPHEN 5-325 MG PO TABS: 1.0000 | ORAL_TABLET | Freq: Four times a day (QID) | ORAL | 0 refills | Status: DC | PRN

## 2017-11-13 MED ORDER — SUGAMMADEX SODIUM 200 MG/2ML IV SOLN
INTRAVENOUS | Status: DC | PRN
  Administered 2017-11-13: 175.2 mg via INTRAVENOUS

## 2017-11-13 MED ORDER — SUGAMMADEX SODIUM 200 MG/2ML IV SOLN
INTRAVENOUS | Status: AC
  Filled 2017-11-13: qty 2

## 2017-11-13 MED ORDER — 0.9 % SODIUM CHLORIDE (POUR BTL) OPTIME
TOPICAL | Status: DC | PRN
  Administered 2017-11-13: 1000 mL

## 2017-11-13 MED ORDER — ROCURONIUM BROMIDE 10 MG/ML (PF) SYRINGE
PREFILLED_SYRINGE | INTRAVENOUS | Status: AC
  Filled 2017-11-13: qty 5

## 2017-11-13 MED ORDER — HYDROMORPHONE HCL 1 MG/ML IJ SOLN: 0.2500 mg | INTRAMUSCULAR | Status: DC | PRN

## 2017-11-13 MED ORDER — PROPOFOL 10 MG/ML IV BOLUS
INTRAVENOUS | Status: DC | PRN
  Administered 2017-11-13: 150 mg via INTRAVENOUS

## 2017-11-13 MED ORDER — ONDANSETRON HCL 4 MG/2ML IJ SOLN
INTRAMUSCULAR | Status: AC
  Filled 2017-11-13: qty 4

## 2017-11-13 MED ORDER — ACETAMINOPHEN 325 MG PO TABS: 650.0000 mg | ORAL_TABLET | ORAL | Status: DC | PRN

## 2017-11-13 MED ORDER — DEXAMETHASONE SODIUM PHOSPHATE 10 MG/ML IJ SOLN
INTRAMUSCULAR | Status: DC | PRN
  Administered 2017-11-13: 10 mg via INTRAVENOUS

## 2017-11-13 MED ORDER — FENTANYL CITRATE (PF) 100 MCG/2ML IJ SOLN
50.0000 ug | Freq: Once | INTRAMUSCULAR | Status: AC
  Administered 2017-11-13: 50 ug via INTRAVENOUS

## 2017-11-13 MED ORDER — CEFAZOLIN SODIUM-DEXTROSE 2-4 GM/100ML-% IV SOLN
2.0000 g | INTRAVENOUS | Status: AC
  Administered 2017-11-13: 2 g via INTRAVENOUS
  Filled 2017-11-13: qty 100

## 2017-11-13 MED ORDER — ROCURONIUM BROMIDE 10 MG/ML (PF) SYRINGE
PREFILLED_SYRINGE | INTRAVENOUS | Status: DC | PRN
  Administered 2017-11-13: 10 mg via INTRAVENOUS
  Administered 2017-11-13: 50 mg via INTRAVENOUS

## 2017-11-13 MED ORDER — FENTANYL CITRATE (PF) 250 MCG/5ML IJ SOLN
INTRAMUSCULAR | Status: AC
  Filled 2017-11-13: qty 5

## 2017-11-13 SURGICAL SUPPLY — 53 items
BENZOIN TINCTURE PRP APPL 2/3 (GAUZE/BANDAGES/DRESSINGS) ×3 IMPLANT
BLADE CLIPPER SURG (BLADE) ×3 IMPLANT
BLADE SURG 15 STRL LF DISP TIS (BLADE) ×1 IMPLANT
BLADE SURG 15 STRL SS (BLADE) ×2
CHLORAPREP W/TINT 26ML (MISCELLANEOUS) ×3 IMPLANT
CLOSURE WOUND 1/2 X4 (GAUZE/BANDAGES/DRESSINGS) ×1
COVER SURGICAL LIGHT HANDLE (MISCELLANEOUS) ×3 IMPLANT
DRAIN PENROSE 1/2X12 LTX STRL (WOUND CARE) ×3 IMPLANT
DRAPE LAPAROTOMY TRNSV 102X78 (DRAPE) IMPLANT
DRAPE UTILITY XL STRL (DRAPES) ×3 IMPLANT
DRSG TEGADERM 4X4.75 (GAUZE/BANDAGES/DRESSINGS) ×3 IMPLANT
ELECT CAUTERY BLADE 6.4 (BLADE) ×3 IMPLANT
ELECT REM PT RETURN 9FT ADLT (ELECTROSURGICAL) ×3
ELECTRODE REM PT RTRN 9FT ADLT (ELECTROSURGICAL) ×1 IMPLANT
GAUZE SPONGE 4X4 12PLY STRL (GAUZE/BANDAGES/DRESSINGS) ×3 IMPLANT
GAUZE SPONGE 4X4 16PLY XRAY LF (GAUZE/BANDAGES/DRESSINGS) ×3 IMPLANT
GLOVE BIO SURGEON STRL SZ 6 (GLOVE) ×3 IMPLANT
GLOVE BIO SURGEON STRL SZ7.5 (GLOVE) ×3 IMPLANT
GLOVE BIOGEL PI IND STRL 6.5 (GLOVE) ×2 IMPLANT
GLOVE BIOGEL PI IND STRL 8 (GLOVE) ×2 IMPLANT
GLOVE BIOGEL PI INDICATOR 6.5 (GLOVE) ×4
GLOVE BIOGEL PI INDICATOR 8 (GLOVE) ×4
GLOVE ECLIPSE 6.0 STRL STRAW (GLOVE) ×3 IMPLANT
GLOVE INDICATOR 8.0 STRL GRN (GLOVE) ×6 IMPLANT
GOWN STRL REUS W/ TWL LRG LVL3 (GOWN DISPOSABLE) ×2 IMPLANT
GOWN STRL REUS W/ TWL XL LVL3 (GOWN DISPOSABLE) ×1 IMPLANT
GOWN STRL REUS W/TWL LRG LVL3 (GOWN DISPOSABLE) ×4
GOWN STRL REUS W/TWL XL LVL3 (GOWN DISPOSABLE) ×2
KIT BASIN OR (CUSTOM PROCEDURE TRAY) ×3 IMPLANT
KIT ROOM TURNOVER OR (KITS) ×3 IMPLANT
MESH ULTRAPRO 3X6 7.6X15CM (Mesh General) ×3 IMPLANT
NEEDLE HYPO 25GX1X1/2 BEV (NEEDLE) ×3 IMPLANT
NS IRRIG 1000ML POUR BTL (IV SOLUTION) ×3 IMPLANT
PACK SURGICAL SETUP 50X90 (CUSTOM PROCEDURE TRAY) ×3 IMPLANT
PAD ARMBOARD 7.5X6 YLW CONV (MISCELLANEOUS) ×3 IMPLANT
PENCIL BUTTON HOLSTER BLD 10FT (ELECTRODE) ×3 IMPLANT
STRIP CLOSURE SKIN 1/2X4 (GAUZE/BANDAGES/DRESSINGS) ×2 IMPLANT
SUT ETHIBOND 0 MO6 C/R (SUTURE) ×3 IMPLANT
SUT MNCRL AB 4-0 PS2 18 (SUTURE) ×3 IMPLANT
SUT SILK 3 0 (SUTURE)
SUT SILK 3-0 18XBRD TIE 12 (SUTURE) IMPLANT
SUT VIC AB 0 CT2 27 (SUTURE) ×3 IMPLANT
SUT VIC AB 2-0 SH 27 (SUTURE) ×2
SUT VIC AB 2-0 SH 27X BRD (SUTURE) ×1 IMPLANT
SUT VIC AB 3-0 SH 27 (SUTURE) ×2
SUT VIC AB 3-0 SH 27XBRD (SUTURE) ×1 IMPLANT
SUT VICRYL AB 3 0 TIES (SUTURE) ×3 IMPLANT
SYR BULB 3OZ (MISCELLANEOUS) ×3 IMPLANT
SYR CONTROL 10ML LL (SYRINGE) ×3 IMPLANT
TOWEL OR 17X24 6PK STRL BLUE (TOWEL DISPOSABLE) ×3 IMPLANT
TOWEL OR 17X26 10 PK STRL BLUE (TOWEL DISPOSABLE) ×3 IMPLANT
TRAY FOLEY CATH SILVER 16FR (SET/KITS/TRAYS/PACK) ×3 IMPLANT
YANKAUER SUCT BULB TIP NO VENT (SUCTIONS) ×3 IMPLANT

## 2017-11-13 NOTE — Op Note (Signed)
Operative Note  Charles Mosley  [id number]  [id number]  11/13/2017   Surgeon: Clovis Riley  Assistant: Dr. Annye English  Procedure performed: Open inguinal hernia repair with UltraPro mesh: left  Preop diagnosis:  left inguinal hernia  Post-op diagnosis/intraop findings: indirect inguinal hernia, cord lipoma  Specimens: none  EBL: 5cc  Complications: none  Description of procedure: After obtaining informed consent and placement of a TAPS block by Dr. Ola Spurr in holding, the patient was taken to the operating room and placed supine on operating room table wheregeneral anesthesia was initiated, preoperative antibiotics were administered, SCDs applied, foley placed and a formal timeout was performed. The groin was clipped, prepped and draped in the usual sterile fashion. An oblique incision was made in the groin after infiltrating the tissues with local anesthetic. Soft tissues were dissected using electrocautery until the external oblique aponeurosis was encountered. It was very attenuated. This was divided sharply to expand the external ring. A plane was bluntly developed between the spermatic cord and the external oblique. The ilioinguinal nerve was divided between hemostats and either end ligated with a vicryl tie. The spermatic cord was then bluntly dissected away from the pubic tubercle and encircled with a Penrose. Inspection of the inguinal anatomy revealed a large indirect hernia and a large lipoma of the cord. The indirect hernia sac was bluntly dissected away from the cord structures. Once we had affirmatively identified the sac, it was carefully opened in a region that was very thin. Inspection confirmed communication with the peritoneal cavity with no bowel contained currently. The sac was then suture ligated at the internal ring with a 0 vicryl and the excess was amputated.  The lipoma was excised and discarded.  A 3 x 6 piece of ultra Pro mesh was brought onto the  field and trimmed to approximate the field. This was tacked to the pubic tubercle fascia using 0 ethibond. [place] 0 ethibond was then used to tack the mesh to the inferior shelving edge. Superiorly the mesh was tacked to the internal oblique using interrupted 0 ethibond. The tails of the mesh were wrapped around the spermatic cord, ensuring adequate room for the cord,and tacked to each other with 0 ethibond, and then directed laterally to lie flat. Hemostasis was ensured within the wound. The Penrose was removed. The external oblique aponeurosis was reapproximated with a running 3-0 Vicryl to re-create a narrowed external ring. Local was infiltrated just deep to the external oblique. The Scarpa's was reapproximated with interrupted 3-0 Vicryls. The skin was closed with a running subcuticular Monocryl. The remainder of the 1% lidocaine with epinephrine was injected in the subcutaneous and subcuticular space. The field was then cleaned, benzoin and Steri-Strips and sterile bandage were applied. Both testicles were palpated in the scrotum at the end of the case. The patient was then awakened extubated and taken to PACU in stable condition.   All counts were correct at the completion of the case

## 2017-11-13 NOTE — Transfer of Care (Signed)
Immediate Anesthesia Transfer of Care Note  Patient: Charles Mosley  Procedure(s) Performed: OPEN LEFT INGUINAL HERNIA REPAIR WITH MESH (Left Inguinal) INSERTION OF MESH LEFT INGUINAL HERNIA (Left Inguinal)  Patient Location: PACU  Anesthesia Type:General  Level of Consciousness: awake, drowsy and patient cooperative  Airway & Oxygen Therapy: Patient Spontanous Breathing and Patient connected to nasal cannula oxygen  Post-op Assessment: Report given to RN, Post -op Vital signs reviewed and stable and Patient moving all extremities  Post vital signs: Reviewed and stable  Last Vitals:  Vitals:   11/13/17 0950 11/13/17 0955  BP: 115/67   Pulse: (!) 59 65  Resp: 16 15  Temp:    SpO2: 97% 98%    Last Pain:  Vitals:   11/13/17 0830  TempSrc: Oral         Complications: No apparent anesthesia complications

## 2017-11-13 NOTE — Anesthesia Procedure Notes (Signed)
Procedure Name: Intubation Date/Time: 11/13/2017 12:45 PM Performed by: Lowella Dell, CRNA Pre-anesthesia Checklist: Patient identified, Emergency Drugs available, Suction available and Patient being monitored Patient Re-evaluated:Patient Re-evaluated prior to induction Oxygen Delivery Method: Circle System Utilized Preoxygenation: Pre-oxygenation with 100% oxygen Induction Type: IV induction Ventilation: Mask ventilation without difficulty and Oral airway inserted - appropriate to patient size Laryngoscope Size: Mac and 3 Grade View: Grade I Tube type: Oral Tube size: 7.0 mm Number of attempts: 1 Airway Equipment and Method: Stylet Placement Confirmation: ETT inserted through vocal cords under direct vision,  positive ETCO2 and breath sounds checked- equal and bilateral Secured at: 23 cm Tube secured with: Tape Dental Injury: Teeth and Oropharynx as per pre-operative assessment

## 2017-11-13 NOTE — Anesthesia Postprocedure Evaluation (Signed)
Anesthesia Post Note  Patient: Charles Mosley  Procedure(s) Performed: OPEN LEFT INGUINAL HERNIA REPAIR WITH MESH (Left Inguinal) INSERTION OF MESH LEFT INGUINAL HERNIA (Left Inguinal)     Patient location during evaluation: PACU Anesthesia Type: General Level of consciousness: sedated and patient cooperative Pain management: pain level controlled Vital Signs Assessment: post-procedure vital signs reviewed and stable Respiratory status: spontaneous breathing Cardiovascular status: stable Anesthetic complications: no    Last Vitals:  Vitals:   11/13/17 1432 11/13/17 1447  BP: (!) 147/91 131/83  Pulse: (!) 57 (!) 53  Resp: 15 19  Temp:    SpO2: 100% 99%    Last Pain:  Vitals:   11/13/17 1447  TempSrc:   PainSc: 2                  Nolon Nations

## 2017-11-13 NOTE — Anesthesia Procedure Notes (Addendum)
Anesthesia Regional Block: TAP block   Pre-Anesthetic Checklist: ,, timeout performed, Correct Patient, Correct Site, Correct Laterality, Correct Procedure, Correct Position, site marked, Risks and benefits discussed, pre-op evaluation,  At surgeon's request and post-op pain management  Laterality: Left  Prep: Maximum Sterile Barrier Precautions used, chloraprep       Needles:  Injection technique: Single-shot  Needle Type: Echogenic Stimulator Needle     Needle Length: 9cm  Needle Gauge: 21     Additional Needles:   Procedures:,,,, ultrasound used (permanent image in chart),,,,  Narrative:  Start time: 11/13/2017 9:40 AM End time: 11/13/2017 9:50 AM Injection made incrementally with aspirations every 5 mL.  Performed by: Personally  Anesthesiologist: Roderic Palau, MD  Additional Notes: 2% Lidocaine skin wheel.

## 2017-11-13 NOTE — H&P (Signed)
Surgical H&P CC: hernia  HPI: this is a very nice 62 year old gentleman who presents with about a one-year history of a large left inguinal hernia.  It does not really cause him pain but he does have some discomfort with certain activities such as bending or lifting.  No issues of constipation or abdominal bloating or nausea.  No urinary symptoms. He would like to have repaired.  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  He intermittently works shifts at [employer] which require long amounts of standing and some heavy lifting.  He smokes a cigar on rare occasions.  No Known Allergies      Past Medical History:  Diagnosis Date  . Asthma   . COPD (chronic obstructive pulmonary disease) ([place])   . Coronary artery disease   . Hypertension   . Nephritis   . OSA on CPAP   . Skin cancer          Past Surgical History:  Procedure Laterality Date  . CORONARY ANGIOPLASTY  2012  . SKIN BIOPSY           Family History  Problem Relation Age of Onset  . Cancer Mother   . Emphysema Father     Social History        Socioeconomic History  . Marital status: Married    Spouse name: Not on file  . Number of children: Not on file  . Years of education: Not on file  . Highest education level: Not on file  Social Needs  . Financial resource strain: Not on file  . Food insecurity - worry: Not on file  . Food insecurity - inability: Not on file  . Transportation needs - medical: Not on file  . Transportation needs - non-medical: Not on file  Occupational History  . Occupation: Unemployed   Tobacco Use  . Smoking status: Light Tobacco Smoker    Types: Cigars  . Smokeless tobacco: Former Systems developer  . Tobacco comment: only has had 1 cigar in the past year 3.1.2018  Substance and Sexual Activity  . Alcohol use: Yes    Comment: weekends  . Drug use: No  . Sexual activity: Not on file  Other Topics Concern  . Not on file  Social  History Narrative  . Not on file          Current Outpatient Medications on File Prior to Visit  Medication Sig Dispense Refill  . albuterol (PROVENTIL HFA;VENTOLIN HFA) 108 (90 Base) MCG/ACT inhaler Inhale 1 puff into the lungs every 6 (six) hours as needed for wheezing or shortness of breath. 3 Inhaler 0  . aspirin EC 81 MG tablet Take 81 mg by mouth every evening.    . fexofenadine (ALLEGRA) 60 MG tablet Take 60 mg by mouth daily.    . mometasone-formoterol (DULERA) 200-5 MCG/ACT AERO Inhale 2 puffs into the lungs 2 (two) times daily. 3 Inhaler 3  . valsartan (DIOVAN) 160 MG tablet Take 160 mg by mouth daily.     No current facility-administered medications on file prior to visit.     Review of Systems: a complete, 10pt review of systems was completed with pertinent positives and negatives as documented in the HPI  Physical Exam: There were no vitals filed for this visit. Gen: A&Ox3, no distress  Head: normocephalic, atraumatic, EOMI, anicteric.  Neck: supple without mass or thyromegaly Chest: unlabored respirations, symmetrical air entry   Cardiovascular: RRR with palpable distal pulses, no pedal edema Abdomen:  soft, nondistended, nontender. There is a partially reducible large left inguinal hernia.  He has a well-healed midline/Pfannenstiel-type T-shaped scar from a prior abdominoplasty. Extremities: warm, without edema, no deformities  Neuro: grossly intact Psych: appropriate mood and affect  Skin: warm and dry   CBC Latest Ref Rng & Units 09/04/2014  WBC 4.0 - 10.5 K/uL 6.4  Hemoglobin 13.0 - 17.0 g/dL 15.9  Hematocrit 39.0 - 52.0 % 46.0  Platelets 150 - 400 K/uL 214    CMP Latest Ref Rng & Units 09/04/2014  Glucose 70 - 99 mg/dL 105(H)  BUN 6 - 23 mg/dL 12  Creatinine 0.50 - 1.35 mg/dL 0.88  Sodium 137 - 147 mEq/L 137  Potassium 3.7 - 5.3 mEq/L 4.6  Chloride 96 - 112 mEq/L 98  CO2 19 - 32 mEq/L 24  Calcium 8.4 - 10.5 mg/dL 9.2    RecentLabs   No results found for: INR, PROTIME    Imaging: ImagingResults(Last48hours)  No results found.      A/P: large left inguinal hernia.  We discussed open repair with mesh.  Discussed the technique of the surgery, risks of bleeding, infection, pain, scarring, injury to surrounding structures including bowel, spermatic cord, testicle, and adjacent nerves resulting in chronic groin pain.  Discussed risk of hernia recurrence and anticipated postoperative restrictions.  He is understanding.  All his questions were answered.    Romana Juniper, MD [facility], [place] Pager [phone]

## 2017-11-13 NOTE — Anesthesia Preprocedure Evaluation (Addendum)
Anesthesia Evaluation  Patient identified by MRN, date of birth, ID band Patient awake    Reviewed: Allergy & Precautions, H&P , NPO status , Patient's Chart, lab work & pertinent test results  Airway Mallampati: II  TM Distance: >3 FB Neck ROM: Full    Dental no notable dental hx. (+) Teeth Intact, Dental Advisory Given   Pulmonary asthma , COPD,  COPD inhaler, Current Smoker,    Pulmonary exam normal breath sounds clear to auscultation       Cardiovascular hypertension, negative cardio ROS   Rhythm:Regular Rate:Normal     Neuro/Psych negative neurological ROS  negative psych ROS   GI/Hepatic negative GI ROS, Neg liver ROS,   Endo/Other  negative endocrine ROS  Renal/GU negative Renal ROS  negative genitourinary   Musculoskeletal   Abdominal   Peds  Hematology negative hematology ROS (+)   Anesthesia Other Findings   Reproductive/Obstetrics negative OB ROS                           Anesthesia Physical Anesthesia Plan  ASA: II  Anesthesia Plan: General   Post-op Pain Management:  Regional for Post-op pain   Induction: Intravenous  PONV Risk Score and Plan: 2 and Ondansetron, Dexamethasone and Midazolam  Airway Management Planned: LMA  Additional Equipment:   Intra-op Plan:   Post-operative Plan: Extubation in OR  Informed Consent: I have reviewed the patients History and Physical, chart, labs and discussed the procedure including the risks, benefits and alternatives for the proposed anesthesia with the patient or authorized representative who has indicated his/her understanding and acceptance.   Dental advisory given  Plan Discussed with: CRNA  Anesthesia Plan Comments:         Anesthesia Quick Evaluation

## 2017-11-13 NOTE — Discharge Instructions (Signed)
HERNIA REPAIR: POST OP INSTRUCTIONS ° °###################################################################### ° °EAT °Gradually transition to a high fiber diet with a fiber supplement over the next few weeks after discharge.  Start with a pureed / full liquid diet (see below) ° °WALK °Walk an hour a day.  Control your pain to do that.   ° °CONTROL PAIN °Control pain so that you can walk, sleep, tolerate sneezing/coughing, go up/down stairs. ° °HAVE A BOWEL MOVEMENT DAILY °Keep your bowels regular to avoid problems.  OK to try a laxative to override constipation.  OK to use an antidairrheal to slow down diarrhea.  Call if not better after 2 tries ° °CALL IF YOU HAVE PROBLEMS/CONCERNS °Call if you are still struggling despite following these instructions. °Call if you have concerns not answered by these instructions ° °###################################################################### ° ° ° °1. DIET: Follow a light bland diet the first 24 hours after arrival home, such as soup, liquids, crackers, etc.  Be sure to include lots of fluids daily.  Avoid fast food or heavy meals as your are more likely to get nauseated.  Eat a low fat the next few days after surgery. °2. Take your usually prescribed home medications unless otherwise directed. °3. PAIN CONTROL: °a. Pain is best controlled by a usual combination of three different methods TOGETHER: °i. Ice/Heat °ii. Over the counter pain medication °iii. Prescription pain medication °b. Most patients will experience some swelling and bruising around the hernia(s) such as the bellybutton, groins, or old incisions.  Ice packs or heating pads (30-60 minutes up to 6 times a day) will help. Use ice for the first few days to help decrease swelling and bruising, then switch to heat to help relax tight/sore spots and speed recovery.  Some people prefer to use ice alone, heat alone, alternating between ice & heat.  Experiment to what works for you.  Swelling and bruising can take  several weeks to resolve.   °c. It is helpful to take an over-the-counter pain medication regularly for the first few weeks.  Choose one of the following that works best for you: °i. Naproxen (Aleve, etc)  Two 220mg tabs twice a day °ii. Ibuprofen (Advil, etc) Three 200mg tabs four times a day (every meal & bedtime) °iii. Acetaminophen (Tylenol, etc) 325-650mg four times a day (every meal & bedtime) °d. A  prescription for pain medication should be given to you upon discharge.  Take your pain medication as prescribed.  °i. If you are having problems/concerns with the prescription medicine (does not control pain, nausea, vomiting, rash, itching, etc), please call us [phone] to see if we need to switch you to a different pain medicine that will work better for you and/or control your side effect better. °ii. If you need a refill on your pain medication, please contact your pharmacy.  They will contact our office to request authorization. Prescriptions will not be filled after 5 pm or on week-ends. °4. Avoid getting constipated.  Between the surgery and the pain medications, it is common to experience some constipation.  Increasing fluid intake and taking a fiber supplement (such as Metamucil, Citrucel, FiberCon, MiraLax, etc) 1-2 times a day regularly will usually help prevent this problem from occurring.  A mild laxative (prune juice, Milk of Magnesia, MiraLax, etc) should be taken according to package directions if there are no bowel movements after 48 hours.   °5. Wash / shower every day.  You may shower over the dressings as they are waterproof.   °6. Remove   your waterproof bandages 3 days after surgery. Steri strips will peel off after 1-2 weeks. You may leave the incision open to air.  You may replace a dressing/Band-Aid to cover the incision for comfort if you wish.  Continue to shower over incision(s) after the dressing is off.    7. ACTIVITIES as tolerated:   a. You may resume regular (light)  daily activities beginning the next day--such as daily self-care, walking, climbing stairs--gradually increasing activities as tolerated.  If you can walk 30 minutes without difficulty, it is safe to try more intense activity such as jogging, treadmill, bicycling, low-impact aerobics, swimming, etc. b. Refrain from the most intensive and strenuous activity for last such as sit-ups, heavy lifting, contact sports, etc  Refrain from any heavy lifting or straining until 6 weeks after surgery.   c. DO NOT PUSH THROUGH PAIN.  Let pain be your guide: If it hurts to do something, don't do it.  Pain is your body warning you to avoid that activity for another week until the pain goes down. d. You may drive when you are no longer taking prescription pain medication, you can comfortably wear a seatbelt, and you can safely maneuver your car and apply brakes. e. Dennis Bast may have sexual intercourse when it is comfortable.  8. FOLLOW UP in our office a. Please call [facility] at [phone] to set up an appointment to see your surgeon in the office for a follow-up appointment approximately 2-3 weeks after your surgery. b. Make sure that you call for this appointment the day you arrive home to insure a convenient appointment time. 9.  IF YOU HAVE DISABILITY OR FAMILY LEAVE FORMS, BRING THEM TO THE OFFICE FOR PROCESSING.  DO NOT GIVE THEM TO YOUR DOCTOR.  WHEN TO CALL us [phone]: 1. Poor pain control 2. Reactions / problems with new medications (rash/itching, nausea, etc)  3. Fever over 101.5 F (38.5 C) 4. Inability to urinate 5. Nausea and/or vomiting 6. Worsening swelling or bruising 7. Continued bleeding from incision. 8. Increased pain, redness, or drainage from the incision   The clinic staff is available to answer your questions during regular business hours (8:30am-5pm).  Please dont hesitate to call and ask to speak to one of our nurses for clinical concerns.   If you have a medical emergency, go to the  nearest emergency room or call 911.  A surgeon from [facility] is always on call at the hospitals in [facility], [place], [place], [place], [place]  [address] ?  [address] [address], [place],    [address] MAIN: [phone] ? TOLL FREE: [phone] ? FAX: [phone] [email or web address]

## 2017-11-16 ENCOUNTER — Encounter (HOSPITAL_COMMUNITY): Payer: Self-pay | Admitting: Surgery

## 2017-11-20 NOTE — Addendum Note (Signed)
Addendum  created 11/20/17 1703 by Roderic Palau, MD   Intraprocedure Blocks edited, Sign clinical note

## 2018-02-02 NOTE — Progress Notes (Deleted)
Cardiology Office Note   Date:  02/02/2018   ID:  Charles Mosley, DOB 13-Aug-1956, MRN [id number]  PCP:  Aura Dials, MD  Cardiologist:   Jenkins Rouge, MD   No chief complaint on file.     History of Present Illness: Charles Mosley is a 62 y.o. male who presents for consultation regarding dyspnea. Referred by Dr Chase Caller. Obese with COPD rare cigar smoking now and OSA using CPAP Moved back to [place] last year. Lack of insurance has limited compliance and refills of meds. I last saw him in 2015 and had normal cath at that time. Chest pain thought to be muscular at that time . Had uncomplicated left inguinal hernia repair 11/13/17 under general anesthesia.   ***    Past Medical History:  Diagnosis Date  . Asthma   . COPD (chronic obstructive pulmonary disease) ([place])   . Coronary artery disease   . Hypertension   . Nephritis   . OSA on CPAP   . Skin cancer     Past Surgical History:  Procedure Laterality Date  . CORONARY ANGIOPLASTY  2012  . INGUINAL HERNIA REPAIR Left 11/13/2017   Procedure: OPEN LEFT INGUINAL HERNIA REPAIR WITH MESH;  Surgeon: Clovis Riley, MD;  Location: [facility];  Service: General;  Laterality: Left;  GENERAL AND TAP BLOCK  . INSERTION OF MESH Left 11/13/2017   Procedure: INSERTION OF MESH LEFT INGUINAL HERNIA;  Surgeon: Clovis Riley, MD;  Location: [facility];  Service: General;  Laterality: Left;  GENERAL AND TAP BLOCK  . SKIN BIOPSY       Current Outpatient Medications  Medication Sig Dispense Refill  . albuterol (PROVENTIL HFA;VENTOLIN HFA) 108 (90 Base) MCG/ACT inhaler Inhale 1 puff into the lungs every 6 (six) hours as needed for wheezing or shortness of breath. 3 Inhaler 0  . aspirin 325 MG tablet Take 325-650 mg by mouth every 6 (six) hours as needed (for pain.).    Marland Kitchen fexofenadine (ALLEGRA) 180 MG tablet Take 180 mg by mouth daily as needed for allergies or rhinitis.    . mometasone-formoterol (DULERA) 200-5 MCG/ACT AERO Inhale  2 puffs into the lungs 2 (two) times daily. (Patient taking differently: Inhale 2 puffs into the lungs at bedtime. ) 3 Inhaler 3  . oxyCODONE-acetaminophen (PERCOCET/ROXICET) 5-325 MG tablet Take 1 tablet by mouth every 6 (six) hours as needed for severe pain. 30 tablet 0   No current facility-administered medications for this visit.     Allergies:   Patient has no known allergies.    Social History:  The patient  reports that he has been smoking cigars.  He has quit using smokeless tobacco. He reports that he drinks alcohol. He reports that he does not use drugs.   Family History:  The patient's family history includes Cancer in his mother; Emphysema in his father.    ROS:  Please see the history of present illness.   Otherwise, review of systems are positive for none.   All other systems are reviewed and negative.    PHYSICAL EXAM: VS:  There were no vitals taken for this visit. , BMI There is no height or weight on file to calculate BMI. Affect appropriate Healthy:  appears stated age 15: normal Neck supple with no adenopathy JVP normal no bruits no thyromegaly Lungs clear with no wheezing and good diaphragmatic motion Heart:  S1/S2 no murmur, no rub, gallop or click PMI normal Abdomen: benighn, BS positve, no tenderness, no  AAA no bruit.  No HSM or HJR Distal pulses intact with no bruits No edema Neuro non-focal Skin warm and dry No muscular weakness    EKG: 11/02/17 SR rate 54 LAD    Recent Labs: 11/02/2017: BUN 11; Creatinine, Ser 0.73; Hemoglobin 15.6; Platelets 208; Potassium 3.8; Sodium 138    Lipid Panel No results found for: CHOL, TRIG, HDL, CHOLHDL, VLDL, LDLCALC, LDLDIRECT    Wt Readings from Last 3 Encounters:  11/13/17 193 lb 3.2 oz (87.6 kg)  11/02/17 193 lb 3.2 oz (87.6 kg)  06/04/17 193 lb (87.5 kg)      Other studies Reviewed: Additional studies/ records that were reviewed today include: Notes cardiology 2015 heart cath. Notes Dr Chase Caller  pulmonary OR report Romana Juniper ECG .    ASSESSMENT AND PLAN:  1.  History of Chest Pain:  Normal cath 2015 *** 2. Asthma/COPD:   3. OSA:  Discussed weight loss and compliance with CPAP   Current medicines are reviewed at length with the patient today.  The patient does not have concerns regarding medicines.  The following changes have been made:  no change  Labs/ tests ordered today include: *** No orders of the defined types were placed in this encounter.    Disposition:   FU with ***     Signed, Jenkins Rouge, MD  02/02/2018 10:19 AM    [facility] [address], [place], [place]  [address] Phone: [phone]; Fax: [phone]

## 2018-02-03 ENCOUNTER — Telehealth: Payer: Self-pay | Admitting: Internal Medicine

## 2018-02-03 DIAGNOSIS — R079 Chest pain, unspecified: Secondary | ICD-10-CM

## 2018-02-03 DIAGNOSIS — I25119 Atherosclerotic heart disease of native coronary artery with unspecified angina pectoris: Secondary | ICD-10-CM

## 2018-02-03 NOTE — Telephone Encounter (Signed)
Do not see any documentation of where MR wanted to send in a referral to cardiology for this patient.   MR, please advise if he needs a referral. He has not been seen by you since August 2018. Thanks!

## 2018-02-04 NOTE — Telephone Encounter (Signed)
There is no documentation that was stated for this to be done.  Pt was a self referral to our office, so pt might be referring himself to Cards as he did to our office.  Will go ahead and send referral to cards for pt.  Order placed for the referral to cards.  [place] and spoke with [facility] letting her know that we did go ahead and place the referral for pt since he was wanting to get re-established with cardiology.  [facility] expressed understanding and stated to me pt had an appt scheduled 02/10/18 with cards and she would place the referral order to that appt.  Nothing further needed at this time.

## 2018-02-04 NOTE — Telephone Encounter (Signed)
I do not have documentation of having said that based on my quick review of 2016-2018 notes. He has been in and out of insurance since moving from the [place] area to [place]. He has hx of CAD. Not sure why he would just show  Up in cardiology office like that. Anyways, ok to do referral to cards for "hx of CAD"

## 2018-02-10 ENCOUNTER — Ambulatory Visit: Payer: 59 | Admitting: Cardiovascular Disease

## 2018-02-18 ENCOUNTER — Telehealth: Payer: Self-pay

## 2018-02-18 NOTE — Telephone Encounter (Signed)
I called pt to see if he can move his appt to 11:30 on 03/01/2018 with MR. LM to call me back to let me know if this is ok.

## 2018-03-01 ENCOUNTER — Encounter: Payer: Self-pay | Admitting: Internal Medicine

## 2018-03-01 ENCOUNTER — Ambulatory Visit (INDEPENDENT_AMBULATORY_CARE_PROVIDER_SITE_OTHER): Payer: 59 | Admitting: Internal Medicine

## 2018-03-01 VITALS — BP 142/82 | HR 68 | Ht 66.0 in | Wt 199.0 lb

## 2018-03-01 DIAGNOSIS — J4540 Moderate persistent asthma, uncomplicated: Secondary | ICD-10-CM | POA: Diagnosis not present

## 2018-03-01 MED ORDER — MOMETASONE FURO-FORMOTEROL FUM 200-5 MCG/ACT IN AERO: 2.0000 | INHALATION_SPRAY | Freq: Every day | RESPIRATORY_TRACT | 11 refills | Status: DC

## 2018-03-01 NOTE — Progress Notes (Signed)
Subjective:     Patient ID: Charles Mosley, male   DOB: 02-Sep-1956, 62 y.o.   MRN: [id number]  HPI  IOV 02/13/2015  Chief Complaint  Patient presents with  . :Pulmonary Consult    Self Referral - Pt moved here from [place] and needs to establish with new pulmonary md -Asthma with some copd - Asthma worsened 3 years ago - Occas sob after eating or with increased stress - Denies cough or wheezing  - Pt also uses cpap    62 year-old morbidly obese male. Used to work for [employer] post and [place] today. 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 Wife also relocated here to work for [employer]. He reports a diagnosis of asthma made over 10 years ago. He's been on Advair which she takes only 1 puff at night along with Singulair for the last few years. His last visit with pulmonologist Dr. Meredith Pel in [place] was he says over a year ago. Last prednisone was over a year ago. Last permit function tests and chest x-ray was over a year ago. Limited outside records from Dr. Meredith Pel shows that he had a clear chest x-ray in December 2014. He was also subjected to methacholine challenge test per the results are known. Patient himself does not recollect any of these test results. Currently feels his asthma is extremely well controlled. His last albuterol use was several months ago. On the 5. asthma control test score was 22 showing good control  Specifically in the last 4 weeks he does not feel his asthma is preventing him from getting any of his work done. He admits to shortness of breath 3-6 times a week in the last 4 weeks. But he does not have any other asthma symptoms at this wheezing coughing chest tightness awaking up at night. Also in the last 4 weeks is not uses albuterol for rescue. Overall he feels his asthma is well controlled.  He does have chronic sleep apnea and uses CPAP but he does not have a sleep Dr.  Marcelline Deist main purpose of  this visit today is to establish care in [facility] and get refills  His exhaled nitric oxide FeNO is 24 ppb and shows good control of asthma   OV 12/25/2016  Chief Complaint  Patient presents with  . Follow-up    Pt states overall his breahting feels slightly worse since last OV due to being off medication, pt last seen in 01/2015. Pt denies significant cough and CP/tightness.       62 year old male last seen in April 2016. Since then he says that he lost his job in [employer] and recommended and therefore his health insurance. He now works at [employer] on a part-time basis. He does not have health insurance. Therefore is not taking any inhaled corticosteroids. He only has Singulair the 90s while at I gave him. He says he is able to get it at 3 of cost but still makes it stretch for a whole year. Taking the Singulair only every few to several days. He says on the days he takes Singulair he feels well but has not been feeling poorly. Currently he is reporting lot more symptoms as documented below the asthma control questionnaire. This is one of the good days because he took Singulair last night. This past week is woken up at least a few times at night because of asthma. When he wakes up he feels he has moderate  symptoms. He is not limited in his activities because of asthma. He has a moderate amount of shortness of breath because of asthma and is wheezing a little of the time and using albuterol for rescue at least 3 or 4 times. He still has some difficulty understanding the difference between maintenance inhalers and as rescue inhalers. He is not sure why despite getting the Singulair free of cost is only taking it every few to several days. He's not sure how much of an inhaler would cost him .   no active chest pain or sputum or fever or chills   OV 06/04/2017  Chief Complaint  Patient presents with  . Follow-up    Pt states his breathing is doing well. Pt denies cough, CP/tightness,  and f/c/s.    62 year old male with clinical moderate persistent asthma. He returns for follow-up. He still works at [employer]. He still does not have insurance. He is going to start a new job and he'll start having health insurance in a month. To 6 months. At this point in time he is not on Singulair. He only takes Dulera 2 puffs 2 times daily. He gets this med cost program for free. He says that asthma is well controlled. There is no albuterol use or chest pain or chest tightness. In fact is 5. asthma control questionnaire is 0. He is not waking up in the night with asthma symptoms. When he wakes up he has no problems. This no shortness of breath or wheezing or albuterol rescue use. He feels good.   OV 03/01/2018  Chief Complaint  Patient presents with  . Follow-up    Pt states he has been doing good since last visit. States he has to clear his throat a lot. Has c/o SOB with exertion.    62 year old male with moderate persistent asthma on [facility]   Last visit August 2018.  Overall asthma has been stable.  Earlier this year he ended up with influenza this was 2 weeks after ventral hernia repair.  In any event he is stable now on [facility].  His asthma control questionnaire 0 out of 5.  Is not waking up in the middle of the night with asthma symptoms when he wakes up he does not have any symptoms.  His activities are not limited.  He experiences very little shortness of breath and is not wheezing at all.  He is only taking his Dulera at night.  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  His health insurance is fully covered.  He smokes an occasional cigar   Asthma Control Panel 02/13/2015  12/25/2016  06/04/2017  03/01/2018   Current Med Regimen advair at night + singulair at night singulair every few days and alb prn Dulera. No singulare dulera and allegra  ACQ 5 point- 1 week (wtd avg score) x 2 0 0.2  FeNO ppB 24 ppb on 02/13/2015  61ppb x x  FeV1  x x x x  Planned  intervention  for visit cotninue advair/singulair - no change in baseline regime  dulera continue dulera at night to continue       has a past medical history of Asthma, COPD (chronic obstructive pulmonary disease) ([place]), Coronary artery disease, Hypertension, Nephritis, OSA on CPAP, and Skin cancer.   reports that he has been smoking cigars.  He has smoked for the past 20.00 years. He has quit using smokeless tobacco.  Past Surgical History:  Procedure Laterality Date  .  CORONARY ANGIOPLASTY  2012  . INGUINAL HERNIA REPAIR Left 11/13/2017   Procedure: OPEN LEFT INGUINAL HERNIA REPAIR WITH MESH;  Surgeon: Clovis Riley, MD;  Location: [facility];  Service: General;  Laterality: Left;  GENERAL AND TAP BLOCK  . INSERTION OF MESH Left 11/13/2017   Procedure: INSERTION OF MESH LEFT INGUINAL HERNIA;  Surgeon: Clovis Riley, MD;  Location: [facility];  Service: General;  Laterality: Left;  GENERAL AND TAP BLOCK  . SKIN BIOPSY      No Known Allergies  Immunization History  Administered Date(s) Administered  . Influenza Split 07/27/2014  . Influenza,inj,Quad PF,6+ Mos 07/27/2016, 07/17/2017  . Pneumococcal Conjugate-13 10/27/2012  . Pneumococcal Polysaccharide-23 10/27/2012    Family History  Problem Relation Age of Onset  . Cancer Mother   . Emphysema Father      Current Outpatient Medications:  .  albuterol (PROVENTIL HFA;VENTOLIN HFA) 108 (90 Base) MCG/ACT inhaler, Inhale 1 puff into the lungs every 6 (six) hours as needed for wheezing or shortness of breath., Disp: 3 Inhaler, Rfl: 0 .  aspirin 325 MG tablet, Take 325-650 mg by mouth every 6 (six) hours as needed (for pain.)., Disp: , Rfl:  .  fexofenadine (ALLEGRA) 180 MG tablet, Take 180 mg by mouth daily as needed for allergies or rhinitis., Disp: , Rfl:  .  mometasone-formoterol (DULERA) 200-5 MCG/ACT AERO, Inhale 2 puffs into the lungs 2 (two) times daily. (Patient taking differently: Inhale 2 puffs into the lungs at bedtime. ),  Disp: 3 Inhaler, Rfl: 3   PULMONARY No results for input(s): PHART, PCO2ART, PO2ART, HCO3, TCO2, O2SAT in the last 168 hours.  Invalid input(s): PCO2, PO2  CBC No results for input(s): HGB, HCT, WBC, PLT in the last 168 hours.  COAGULATION No results for input(s): INR in the last 168 hours.  CARDIAC  No results for input(s): TROPONINI in the last 168 hours. No results for input(s): PROBNP in the last 168 hours.   CHEMISTRY No results for input(s): NA, K, CL, CO2, GLUCOSE, BUN, CREATININE, CALCIUM, MG, PHOS in the last 168 hours. CrCl cannot be calculated (Patient's most recent lab result is older than the maximum 21 days allowed.).   LIVER No results for input(s): AST, ALT, ALKPHOS, BILITOT, PROT, ALBUMIN, INR in the last 168 hours.   INFECTIOUS No results for input(s): LATICACIDVEN, PROCALCITON in the last 168 hours.   ENDOCRINE CBG (last 3)  No results for input(s): GLUCAP in the last 72 hours.       IMAGING x48h  - image(s) personally visualized  -   highlighted in bold No results found.     Review of Systems     Objective:   Physical Exam Vitals:   03/01/18 1217  BP: (!) 142/82  Pulse: 68  SpO2: 96%  Weight: 199 lb (90.3 kg)  Height: 5\' 6"  (1.676 m)    Estimated body mass index is 32.12 kg/m as calculated from the following:   Height as of this encounter: 5\' 6"  (1.676 m).   Weight as of this encounter: 199 lb (90.3 kg).     Assessment:       ICD-10-CM   1. Asthma, moderate persistent, well-controlled J45.40        Plan:      Well controlled asthma Too bad you had flu earlier in 2019 but glad you are well now CMA says you have had prevnar  Plan Continue dulera 2 puff daily at night Albuterol as needed Try to quit  smoking cigars   Followup 9 months or sooner - ACQ/FeNo at followup Reutrn or call sooner if needed     Dr. Brand Males, M.D., [facility].C.P Pulmonary and Critical Care Medicine Staff Physician, [place] Director - Interstitial Lung Disease  Program  Pulmonary [place] [facility] [place], [place], [address]  Pager: [phone], If no answer or between  15:00h - 7:00h: call [phone] Telephone: [phone]

## 2018-03-01 NOTE — Patient Instructions (Addendum)
ICD-10-CM   1. Asthma, moderate persistent, well-controlled J45.40    Well controlled asthma Too bad you had flu earlier in 2019 but glad you are well now CMA says you have had prevnar  Plan Continue dulera 2 puff daily at night Albuterol as needed Try to quit smoking cigars   Followup 9 months or sooner - ACQ/FeNo at followup Reutrn or call sooner if needed

## 2018-03-02 ENCOUNTER — Telehealth: Payer: Self-pay | Admitting: Internal Medicine

## 2018-03-02 NOTE — Telephone Encounter (Signed)
PA initiated today on www.covermymeds.com for Durlera 200 Key: [id number] - PA Case ID: [id number]

## 2018-03-04 NOTE — Telephone Encounter (Signed)
Additional information needed for PA.  PA sent in and is pending 03/04/18.

## 2018-03-05 NOTE — Telephone Encounter (Signed)
Still Pending 03/05/18

## 2018-03-18 NOTE — Telephone Encounter (Signed)
PA initiated on www.[email or web address] today.  Key: [id number] for Durlera [id number] aer It takes 7-10 business days for decision of approval or denial.

## 2018-03-26 NOTE — Telephone Encounter (Addendum)
Called [facility] and spoke with pharmacist  Symbicort 160  will cost 85$ and Breo 100 will cost 85$  Are there any other choices that are appropriate before I call him to see if this is affordale?

## 2018-03-26 NOTE — Telephone Encounter (Signed)
He can do symbicort 160/4,5 @ 2 puff bid - but if control good on that he can reduce it to 1 puff bid to save $

## 2018-03-26 NOTE — Telephone Encounter (Signed)
Checked Cover My Meds. PA was denied.  MR - please advise. Thanks.

## 2018-03-26 NOTE — Telephone Encounter (Signed)
What is approved? He can do breo low dose or he can do symbuicort high dose

## 2018-03-26 NOTE — Telephone Encounter (Signed)
LMTCB

## 2018-03-29 ENCOUNTER — Telehealth: Payer: Self-pay | Admitting: Internal Medicine

## 2018-03-29 NOTE — Progress Notes (Signed)
Cardiology Office Note   Date:  03/30/2018   ID:  Charles Mosley, DOB 1956/09/16, MRN [id number]  PCP:  Dineen Kid, MD  Cardiologist:   Jenkins Rouge, MD   No chief complaint on file.     History of Present Illness: Charles Mosley is a 63 y.o. male who presents for consultation regarding chest pain Referred by Dr Chase Caller .  Obese male with history of asthma and COPD with OSA using CPAP.  I last saw him in 2015 for chest pain with negative w/u. Had cath in 2012 that was normal We did not do further testing at that time   His pains seem muscular and related to chronic shoulder issue. Working at [employer] care now. Daughter Thurman Coyer also graduating from [employer] this Friday. He is an old gym rat and feels better when he works out. Mild exertional dyspnea No resting pain no associated pleurisy cough or sputum   Past Medical History:  Diagnosis Date  . Asthma   . COPD (chronic obstructive pulmonary disease) ([place])   . Coronary artery disease   . Hypertension   . Nephritis   . OSA on CPAP   . Skin cancer     Past Surgical History:  Procedure Laterality Date  . CORONARY ANGIOPLASTY  2012  . INGUINAL HERNIA REPAIR Left 11/13/2017   Procedure: OPEN LEFT INGUINAL HERNIA REPAIR WITH MESH;  Surgeon: Clovis Riley, MD;  Location: [facility];  Service: General;  Laterality: Left;  GENERAL AND TAP BLOCK  . INSERTION OF MESH Left 11/13/2017   Procedure: INSERTION OF MESH LEFT INGUINAL HERNIA;  Surgeon: Clovis Riley, MD;  Location: [facility];  Service: General;  Laterality: Left;  GENERAL AND TAP BLOCK  . SKIN BIOPSY       Current Outpatient Medications  Medication Sig Dispense Refill  . albuterol (PROVENTIL HFA;VENTOLIN HFA) 108 (90 Base) MCG/ACT inhaler Inhale 1 puff into the lungs every 6 (six) hours as needed for wheezing or shortness of breath. 3 Inhaler 0  . aspirin 325 MG tablet Take 325-650 mg by mouth every 6 (six) hours as needed (for pain.).    Marland Kitchen fexofenadine (ALLEGRA)  180 MG tablet Take 180 mg by mouth daily as needed for allergies or rhinitis.    . mometasone-formoterol (DULERA) 200-5 MCG/ACT AERO Inhale 2 puffs into the lungs at bedtime. 1 Inhaler 11   No current facility-administered medications for this visit.     Allergies:   Patient has no known allergies.    Social History:  The patient  reports that he has been smoking cigars.  He has smoked for the past 20.00 years. He has quit using smokeless tobacco. He reports that he drinks alcohol. He reports that he does not use drugs.   Family History:  The patient's family history includes Cancer in his mother; Emphysema in his father.    ROS:  Please see the history of present illness.   Otherwise, review of systems are positive for none.   All other systems are reviewed and negative.    PHYSICAL EXAM: VS:  BP 122/82   Pulse 80   Ht 5\' 6"  (1.676 m)   Wt 198 lb 8 oz (90 kg)   SpO2 96%   BMI 32.04 kg/m  , BMI Body mass index is 32.04 kg/m. Affect appropriate Healthy:  appears stated age 33: normal Neck supple with no adenopathy JVP normal no bruits no thyromegaly Lungs clear with no wheezing and good diaphragmatic  motion Heart:  S1/S2 no murmur, no rub, gallop or click PMI normal Abdomen: benighn, BS positve, no tenderness, no AAA no bruit.  No HSM or HJR Distal pulses intact with no bruits No edema Neuro non-focal Skin warm and dry No muscular weakness    EKG:  SR rate 54 normal 11/02/17 03/30/18  NSR rate 75 normal    Recent Labs: 11/02/2017: BUN 11; Creatinine, Ser 0.73; Hemoglobin 15.6; Platelets 208; Potassium 3.8; Sodium 138    Lipid Panel No results found for: CHOL, TRIG, HDL, CHOLHDL, VLDL, LDLCALC, LDLDIRECT    Wt Readings from Last 3 Encounters:  03/30/18 198 lb 8 oz (90 kg)  03/01/18 199 lb (90.3 kg)  11/13/17 193 lb 3.2 oz (87.6 kg)      Other studies Reviewed: Additional studies/ records that were reviewed today include: Notes from cardiology 2015 Notes from  pulmonary CXR, CT ECGs and labs .    ASSESSMENT AND PLAN:  1.  Chest Pain atypical normal cath 2012 and previous false positive myovue F/u ETT Ordered if not normal favor cardiac CTA  2. HTN Well controlled.  Continue current medications and low sodium Dash type diet.   3. OSA  Weight loss has helped CPAP f/u primar y 4. Asthma no active wheezing has rescue inhaler  5. Allergies  PRN anti histamine    Current medicines are reviewed at length with the patient today.  The patient does not have concerns regarding medicines.  The following changes have been made:  no change  Labs/ tests ordered today include: ETT   Orders Placed This Encounter  Procedures  . EXERCISE TOLERANCE TEST (ETT)  . EKG 12-Lead     Disposition:   FU with cardiology PRN      Signed, Jenkins Rouge, MD  03/30/2018 9:42 AM    [facility] [place], [place],   [address] Phone: [phone]; Fax: [phone]

## 2018-03-29 NOTE — Telephone Encounter (Signed)
Called patient, unable to reach left message to give us a call back. 

## 2018-03-30 ENCOUNTER — Encounter (INDEPENDENT_AMBULATORY_CARE_PROVIDER_SITE_OTHER): Payer: Self-pay

## 2018-03-30 ENCOUNTER — Encounter: Payer: Self-pay | Admitting: Cardiovascular Disease

## 2018-03-30 ENCOUNTER — Ambulatory Visit (INDEPENDENT_AMBULATORY_CARE_PROVIDER_SITE_OTHER): Payer: 59 | Admitting: Cardiovascular Disease

## 2018-03-30 VITALS — BP 122/82 | HR 80 | Ht 66.0 in | Wt 198.5 lb

## 2018-03-30 DIAGNOSIS — R079 Chest pain, unspecified: Secondary | ICD-10-CM

## 2018-03-30 DIAGNOSIS — I10 Essential (primary) hypertension: Secondary | ICD-10-CM | POA: Diagnosis not present

## 2018-03-30 NOTE — Telephone Encounter (Signed)
ATC pt, no answer. Left message for pt to call back.  

## 2018-03-30 NOTE — Patient Instructions (Addendum)
Medication Instructions:  Your physician recommends that you continue on your current medications as directed. Please refer to the Current Medication list given to you today.  Labwork: NONE  Testing/Procedures: Your physician has requested that you have an exercise tolerance test. For further information please visit www.[email or web address]. Please also follow instruction sheet, as given.  Follow-Up: Your physician wants you to follow-up as needed with Dr. Nishan.    If you need a refill on your cardiac medications before your next appointment, please call your pharmacy.    

## 2018-03-30 NOTE — Telephone Encounter (Signed)
I wanted to call wife cell phone on file but there is no release of information to speak with her.

## 2018-03-30 NOTE — Telephone Encounter (Signed)
Spoke with pt and he states he will call us back after he researches his options according to previous message. He will call us back with an update. Will close encounter for now.    Charles Males, MD      4:51 PM  Note    He can do symbicort 160/4,5 @ 2 puff bid - but if control good on that he can reduce it to 1 puff bid to save $          3:39 PM  [name], CMA routed this conversation to Charles Males, MD  [name], CMA      3:32 PM  Note    [name] and spoke with pharmacist  Symbicort 160  will cost 85$ and Breo 100 will cost 85$  Are there any other choices that are appropriate before I call him to see if this is affordale?          3:16 PM  Charles Males, MD routed this conversation to [facility] Triage [name], MD      3:15 PM  Note    What is approved? He can do breo low dose or he can do symbuicort high dose

## 2018-04-02 ENCOUNTER — Other Ambulatory Visit: Payer: Self-pay | Admitting: *Deleted

## 2018-04-07 ENCOUNTER — Ambulatory Visit (INDEPENDENT_AMBULATORY_CARE_PROVIDER_SITE_OTHER): Payer: 59

## 2018-04-07 DIAGNOSIS — I10 Essential (primary) hypertension: Secondary | ICD-10-CM

## 2018-04-07 DIAGNOSIS — R079 Chest pain, unspecified: Secondary | ICD-10-CM | POA: Diagnosis not present

## 2018-04-07 LAB — EXERCISE TOLERANCE TEST
CHL CUP RESTING HR STRESS: 69 {beats}/min
CHL RATE OF PERCEIVED EXERTION: 15
CSEPEDS: 0 s
CSEPEW: 10.1 METS
CSEPHR: 92 %
Exercise duration (min): 9 min
MPHR: 156 {beats}/min
Peak HR: 144 {beats}/min

## 2018-11-26 ENCOUNTER — Encounter: Payer: Self-pay | Admitting: Internal Medicine

## 2018-11-26 ENCOUNTER — Ambulatory Visit (INDEPENDENT_AMBULATORY_CARE_PROVIDER_SITE_OTHER): Payer: Self-pay | Admitting: Internal Medicine

## 2018-11-26 VITALS — BP 134/80 | HR 74 | Ht 66.0 in | Wt 205.8 lb

## 2018-11-26 DIAGNOSIS — J4540 Moderate persistent asthma, uncomplicated: Secondary | ICD-10-CM

## 2018-11-26 LAB — NITRIC OXIDE: Nitric Oxide: 19

## 2018-11-26 NOTE — Addendum Note (Signed)
Addended by: Parke Poisson E on: 11/26/2018 10:50 AM   Modules accepted: Orders

## 2018-11-26 NOTE — Patient Instructions (Addendum)
ICD-10-CM   1. Asthma, moderate persistent, well-controlled J45.40     Glad you are over your recent respiratory infection Currently asthma is well controlled and stable Glad you are up-to-date with your respiratory vaccines  PLAn -Continue Dulera at night as before but on a scheduled basis -Use albuterol as needed - Please talk to PCP Via, Charles Bihari, MD -  and ensure you get  shingrix (GSK) inactivated vaccine against shingles   Followup - 9 months or sooner; HCQ and exhaled nitric oxide at follow-up -Any respiratory exacerbation symptoms please call us or come sooner

## 2018-11-26 NOTE — Progress Notes (Signed)
IOV 02/13/2015  Chief Complaint  Patient presents with  . :Pulmonary Consult    Self Referral - Pt moved here from [place] and needs to establish with new pulmonary md -Asthma with some copd - Asthma worsened 3 years ago - Occas sob after eating or with increased stress - Denies cough or wheezing  - Pt also uses cpap    63 year-old morbidly obese male. Used to work for [employer] post and [place] today. 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 Wife also relocated here to work for [employer]. He reports a diagnosis of asthma made over 10 years ago. He's been on Advair which she takes only 1 puff at night along with Singulair for the last few years. His last visit with pulmonologist Dr. Meredith Pel in [place] was he says over a year ago. Last prednisone was over a year ago. Last permit function tests and chest x-ray was over a year ago. Limited outside records from Dr. Meredith Pel shows that he had a clear chest x-ray in December 2014. He was also subjected to methacholine challenge test per the results are known. Patient himself does not recollect any of these test results. Currently feels his asthma is extremely well controlled. His last albuterol use was several months ago. On the 5. asthma control test score was 22 showing good control  Specifically in the last 4 weeks he does not feel his asthma is preventing him from getting any of his work done. He admits to shortness of breath 3-6 times a week in the last 4 weeks. But he does not have any other asthma symptoms at this wheezing coughing chest tightness awaking up at night. Also in the last 4 weeks is not uses albuterol for rescue. Overall he feels his asthma is well controlled.  He does have chronic sleep apnea and uses CPAP but he does not have a sleep Dr.  Marcelline Deist main purpose of this visit today is to establish care in [facility] and get refills  His exhaled nitric oxide FeNO  is 24 ppb and shows good control of asthma   OV 12/25/2016  Chief Complaint  Patient presents with  . Follow-up    Pt states overall his breahting feels slightly worse since last OV due to being off medication, pt last seen in 01/2015. Pt denies significant cough and CP/tightness.       63 year old male last seen in April 2016. Since then he says that he lost his job in [employer] and recommended and therefore his health insurance. He now works at [employer] on a part-time basis. He does not have health insurance. Therefore is not taking any inhaled corticosteroids. He only has Singulair the 90s while at I gave him. He says he is able to get it at 3 of cost but still makes it stretch for a whole year. Taking the Singulair only every few to several days. He says on the days he takes Singulair he feels well but has not been feeling poorly. Currently he is reporting lot more symptoms as documented below the asthma control questionnaire. This is one of the good days because he took Singulair last night. This past week is woken up at least a few times at night because of asthma. When he wakes up he feels he has moderate symptoms. He is not limited in his activities because of asthma. He has a moderate amount of shortness of breath because  of asthma and is wheezing a little of the time and using albuterol for rescue at least 3 or 4 times. He still has some difficulty understanding the difference between maintenance inhalers and as rescue inhalers. He is not sure why despite getting the Singulair free of cost is only taking it every few to several days. He's not sure how much of an inhaler would cost him .   no active chest pain or sputum or fever or chills   OV 06/04/2017  Chief Complaint  Patient presents with  . Follow-up    Pt states his breathing is doing well. Pt denies cough, CP/tightness, and f/c/s.    63 year old male with clinical moderate persistent asthma. He returns for follow-up.  He still works at [employer]. He still does not have insurance. He is going to start a new job and he'll start having health insurance in a month. To 6 months. At this point in time he is not on Singulair. He only takes Dulera 2 puffs 2 times daily. He gets this med cost program for free. He says that asthma is well controlled. There is no albuterol use or chest pain or chest tightness. In fact is 5. asthma control questionnaire is 0. He is not waking up in the night with asthma symptoms. When he wakes up he has no problems. This no shortness of breath or wheezing or albuterol rescue use. He feels good.   OV 03/01/2018  Chief Complaint  Patient presents with  . Follow-up    Pt states he has been doing good since last visit. States he has to clear his throat a lot. Has c/o SOB with exertion.    63 year old male with moderate persistent asthma on [facility]   Last visit August 2018.  Overall asthma has been stable.  Earlier this year he ended up with influenza this was 2 weeks after ventral hernia repair.  In any event he is stable now on [facility].  His asthma control questionnaire 0 out of 5.  Is not waking up in the middle of the night with asthma symptoms when he wakes up he does not have any symptoms.  His activities are not limited.  He experiences very little shortness of breath and is not wheezing at all.  He is only taking his Dulera at night.  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  His health insurance is fully covered.  He smokes an occasional cigar   OV 11/26/2018  Subjective:  Patient ID: Charles Mosley, male , DOB: 12-15-1955 , age 64 y.o. , MRN: [id number] , ADDRESS: [id number] [id number]   11/26/2018 -   Chief Complaint  Patient presents with  . Follow-up    Pt stated he has been doing good since last visit but states about 4 weeks ago he had a virus to where he was congested, coughing, and sneezing. Pt states he is now feeling  better and has been doing mucinex.     HPI [name] 63 y.o. -returns for routine follow-up.  He tells me that he now works at [employer]  Approximately 3 weeks ago a lot of sick contacts at work including if you got hospitalized for pneumonia.  He then subsequently picked up really bad "crud".  After this he had bad sinus infection with coughing and wheezing.  The cough is pretty intense.  Slowly little bit but but the symptoms have significantly improved and  largely resolved.  A few days ago he even had wheezing but in the last 3 days he has not had any problems including shortness of breath cough or wheezing.  His HCQ shows that he is not waking up in the middle of the night with asthma symptoms.  When he wakes up he does not have any symptoms.  He is not limited in his activities.  He is not short of breath.  He is breathing a little of the time but that was a few days ago.  Not using albuterol inhaler for rescue in the last few days.  Is up-to-date with his respiratory vaccines.  Exhaled nitric oxide today is 19ppb and normal     Asthma Control Panel 02/13/2015  12/25/2016  06/04/2017  03/01/2018  11/26/2018   Current Med Regimen advair at night + singulair at night singulair every few days and alb prn Dulera. No singulare dulera and allegra dulera at night (URI 3 weeks earlier()  ACQ 5 point- 1 week (wtd avg score) x 2 0 0.2 0.4  FeNO ppB 24 ppb on 02/13/2015  61ppb x x   FeV1  x x x x   Planned intervention  for visit cotninue advair/singulair - no change in baseline regime  dulera continue dulera at night to continue     ROS - per HPI     has a past medical history of Asthma, COPD (chronic obstructive pulmonary disease) ([place]), Coronary artery disease, Hypertension, Nephritis, OSA on CPAP, and Skin cancer.   reports that he has been smoking cigars. He has smoked for the past 20.00 years. He has quit using smokeless tobacco.  Past Surgical History:  Procedure Laterality  Date  . CORONARY ANGIOPLASTY  2012  . INGUINAL HERNIA REPAIR Left 11/13/2017   Procedure: OPEN LEFT INGUINAL HERNIA REPAIR WITH MESH;  Surgeon: Clovis Riley, MD;  Location: [place];  Service: General;  Laterality: Left;  GENERAL AND TAP BLOCK  . INSERTION OF MESH Left 11/13/2017   Procedure: INSERTION OF MESH LEFT INGUINAL HERNIA;  Surgeon: Clovis Riley, MD;  Location: [place];  Service: General;  Laterality: Left;  GENERAL AND TAP BLOCK  . SKIN BIOPSY      No Known Allergies  Immunization History  Administered Date(s) Administered  . Influenza Split 07/27/2014  . Influenza, Quadrivalent, Recombinant, Inj, Pf 08/13/2018  . Influenza,inj,Quad PF,6+ Mos 07/27/2016, 07/17/2017  . Pneumococcal Conjugate-13 10/27/2012  . Pneumococcal Polysaccharide-23 10/27/2012    Family History  Problem Relation Age of Onset  . Cancer Mother   . Emphysema Father      Current Outpatient Medications:  .  albuterol (PROVENTIL HFA;VENTOLIN HFA) 108 (90 Base) MCG/ACT inhaler, Inhale 1 puff into the lungs every 6 (six) hours as needed for wheezing or shortness of breath., Disp: 3 Inhaler, Rfl: 0 .  aspirin EC 81 MG tablet, Take 81 mg by mouth daily., Disp: , Rfl:  .  fexofenadine (ALLEGRA) 180 MG tablet, Take 180 mg by mouth daily as needed for allergies or rhinitis., Disp: , Rfl:  .  mometasone-formoterol (DULERA) 200-5 MCG/ACT AERO, Inhale 2 puffs into the lungs at bedtime., Disp: 1 Inhaler, Rfl: 11      Objective:   Vitals:   11/26/18 1002  BP: 134/80  Pulse: 74  SpO2: 100%  Weight: 205 lb 12.8 oz (93.4 kg)  Height: 5\' 6"  (1.676 m)    Estimated body mass index is 33.22 kg/m as calculated from the following:   Height as of  this encounter: 5\' 6"  (1.676 m).   Weight as of this encounter: 205 lb 12.8 oz (93.4 kg).  @WEIGHTCHANGE @  [employer]   11/26/18 1002  Weight: 205 lb 12.8 oz (93.4 kg)     Physical Exam  General Appearance:    Alert, cooperative, no distress, appears  stated age - yes , Deconditioned looking - no , OBESE  - yes, Sitting on Wheelchair -  no  Head:    Normocephalic, without obvious abnormality, atraumatic  Eyes:    PERRL, conjunctiva/corneas clear,  Ears:    Normal TM's and external ear canals, both ears  Nose:   Nares normal, septum midline, mucosa normal, no drainage    or sinus tenderness. OXYGEN ON  - no . Patient is @ ra   Throat:   Lips, mucosa, and tongue normal; teeth and Mosley normal. Cyanosis on lips - no  Neck:   Supple, symmetrical, trachea midline, no adenopathy;    thyroid:  no enlargement/tenderness/nodules; no carotid   bruit or JVD  Back:     Symmetric, no curvature, ROM normal, no CVA tenderness  Lungs:     Distress - no , Wheeze no, Barrell Chest - no, Purse lip breathing - no, Crackles - no   Chest Wall:    No tenderness or deformity.    Heart:    Regular rate and rhythm, S1 and S2 normal, no rub   or gallop, Murmur - no  Breast Exam:    NOT DONE  Abdomen:     Soft, non-tender, bowel sounds active all four quadrants,    no masses, no organomegaly. Visceral obesity - yes  Genitalia:   NOT DONE  Rectal:   NOT DONE  Extremities:   Extremities - normal, Has Cane - no, Clubbing - no, Edema - no  Pulses:   2+ and symmetric all extremities  Skin:   Stigmata of Connective Tissue Disease - no  Lymph nodes:   Cervical, supraclavicular, and axillary nodes normal  Psychiatric:  Neurologic:   Pleasant - yes, Anxious - no, Flat affect - no  CAm-ICU - neg, Alert and Oriented x 3 - yes, Moves all 4s - yes, Speech - normal, Cognition - intact           Assessment:       ICD-10-CM   1. Asthma, moderate persistent, well-controlled J45.40        Plan:     Patient Instructions     ICD-10-CM   1. Asthma, moderate persistent, well-controlled J45.40     Glad you are over your recent respiratory infection Currently asthma is well controlled and stable Glad you are up-to-date with your respiratory  vaccines  PLAn -Continue Dulera at night as before but on a scheduled basis -Use albuterol as needed - Please talk to PCP Via, Lennette Bihari, MD -  and ensure you get  shingrix (GSK) inactivated vaccine against shingles   Followup - 9 months or sooner; HCQ and exhaled nitric oxide at follow-up -Any respiratory exacerbation symptoms please call us or come sooner      SIGNATURE    Dr. Brand Males, M.D., F.C.C.P,  Pulmonary and Critical Care Medicine Staff Physician, [place] Director - Interstitial Lung Disease  Program  [facility] [place], [place], [address]  Pager: [phone], If no answer or between  15:00h - 7:00h: call [phone] Telephone: [phone]  10:18 AM 11/26/2018

## 2019-07-26 ENCOUNTER — Telehealth: Payer: Self-pay | Admitting: Internal Medicine

## 2019-07-26 MED ORDER — FLUTICASONE-SALMETEROL 250-50 MCG/DOSE IN AEPB: 1.0000 | INHALATION_SPRAY | Freq: Two times a day (BID) | RESPIRATORY_TRACT | 3 refills | Status: DC

## 2019-07-26 NOTE — Telephone Encounter (Signed)
LMTCB. Refill sent.

## 2019-07-26 NOTE — Telephone Encounter (Signed)
Called and spoke with Patient.  Patient stated his current insurance company will not cover [place] and it is to expensive.  Patient stated he was using Advair before and felt like it worked well for him.  Patient stated his insurance company told him they would cover Advair.  Patient stated he would like a 90 days supply sent to [facility].  Message routed to [facility], NP to advise

## 2019-07-26 NOTE — Telephone Encounter (Signed)
Ok that is fine  What does was he on , I could not find in chart ?  Make sure has follow up in office as rec from last ov

## 2019-07-26 NOTE — Telephone Encounter (Signed)
ATC Patient.  LM to call back when available.  

## 2019-07-26 NOTE — Telephone Encounter (Signed)
Pt returning call and can be reached @ [phone].Charles Mosley

## 2019-07-27 NOTE — Telephone Encounter (Signed)
Noted  

## 2019-07-27 NOTE — Telephone Encounter (Signed)
Pt returning call and I let him know that refill was sent and he was fine with that.Charles Mosley

## 2019-12-27 ENCOUNTER — Telehealth: Payer: Self-pay | Admitting: Internal Medicine

## 2019-12-27 MED ORDER — FLUTICASONE-SALMETEROL 250-50 MCG/DOSE IN AEPB: 1.0000 | INHALATION_SPRAY | Freq: Two times a day (BID) | RESPIRATORY_TRACT | 3 refills | Status: DC

## 2019-12-27 NOTE — Telephone Encounter (Signed)
Medication refill for Advair has been sent to [facility] with 90 day refill. Patient has been made aware.

## 2020-02-20 ENCOUNTER — Other Ambulatory Visit: Payer: Self-pay

## 2020-02-20 ENCOUNTER — Ambulatory Visit (INDEPENDENT_AMBULATORY_CARE_PROVIDER_SITE_OTHER): Payer: No Typology Code available for payment source | Admitting: Internal Medicine

## 2020-02-20 ENCOUNTER — Encounter: Payer: Self-pay | Admitting: Internal Medicine

## 2020-02-20 VITALS — BP 132/80 | HR 67 | Ht 66.0 in | Wt 209.4 lb

## 2020-02-20 DIAGNOSIS — R0789 Other chest pain: Secondary | ICD-10-CM

## 2020-02-20 DIAGNOSIS — Z7189 Other specified counseling: Secondary | ICD-10-CM

## 2020-02-20 DIAGNOSIS — J4540 Moderate persistent asthma, uncomplicated: Secondary | ICD-10-CM | POA: Diagnosis not present

## 2020-02-20 DIAGNOSIS — Z7185 Encounter for immunization safety counseling: Secondary | ICD-10-CM

## 2020-02-20 NOTE — Progress Notes (Signed)
IOV 02/13/2015  Chief Complaint  Patient presents with  . :Pulmonary Consult    Self Referral - Pt moved here from [place] and needs to establish with new pulmonary md -Asthma with some copd - Asthma worsened 3 years ago - Occas sob after eating or with increased stress - Denies cough or wheezing  - Pt also uses cpap    64 year-old morbidly obese male. Used to work for [employer] post and [place] today. 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 Wife also relocated here to work for [employer]. He reports a diagnosis of asthma made over 10 years ago. He's been on Advair which she takes only 1 puff at night along with Singulair for the last few years. His last visit with pulmonologist Dr. Meredith Pel in [place] was he says over a year ago. Last prednisone was over a year ago. Last permit function tests and chest x-ray was over a year ago. Limited outside records from Dr. Meredith Pel shows that he had a clear chest x-ray in December 2014. He was also subjected to methacholine challenge test per the results are known. Patient himself does not recollect any of these test results. Currently feels his asthma is extremely well controlled. His last albuterol use was several months ago. On the 5. asthma control test score was 22 showing good control  Specifically in the last 4 weeks he does not feel his asthma is preventing him from getting any of his work done. He admits to shortness of breath 3-6 times a week in the last 4 weeks. But he does not have any other asthma symptoms at this wheezing coughing chest tightness awaking up at night. Also in the last 4 weeks is not uses albuterol for rescue. Overall he feels his asthma is well controlled.  He does have chronic sleep apnea and uses CPAP but he does not have a sleep Dr.  Marcelline Deist main purpose of this visit today is to establish care in [facility] and get refills  His exhaled nitric oxide FeNO  is 24 ppb and shows good control of asthma   OV 12/25/2016  Chief Complaint  Patient presents with  . Follow-up    Pt states overall his breahting feels slightly worse since last OV due to being off medication, pt last seen in 01/2015. Pt denies significant cough and CP/tightness.       64 year old male last seen in April 2016. Since then he says that he lost his job in [employer] and recommended and therefore his health insurance. He now works at [employer] on a part-time basis. He does not have health insurance. Therefore is not taking any inhaled corticosteroids. He only has Singulair the 90s while at I gave him. He says he is able to get it at 3 of cost but still makes it stretch for a whole year. Taking the Singulair only every few to several days. He says on the days he takes Singulair he feels well but has not been feeling poorly. Currently he is reporting lot more symptoms as documented below the asthma control questionnaire. This is one of the good days because he took Singulair last night. This past week is woken up at least a few times at night because of asthma. When he wakes up he feels he has moderate symptoms. He is not limited in his activities because of asthma. He has a moderate amount of shortness of breath because  of asthma and is wheezing a little of the time and using albuterol for rescue at least 3 or 4 times. He still has some difficulty understanding the difference between maintenance inhalers and as rescue inhalers. He is not sure why despite getting the Singulair free of cost is only taking it every few to several days. He's not sure how much of an inhaler would cost him .   no active chest pain or sputum or fever or chills   OV 06/04/2017  Chief Complaint  Patient presents with  . Follow-up    Pt states his breathing is doing well. Pt denies cough, CP/tightness, and f/c/s.    64 year old male with clinical moderate persistent asthma. He returns for follow-up.  He still works at [employer]. He still does not have insurance. He is going to start a new job and he'll start having health insurance in a month. To 6 months. At this point in time he is not on Singulair. He only takes Dulera 2 puffs 2 times daily. He gets this med cost program for free. He says that asthma is well controlled. There is no albuterol use or chest pain or chest tightness. In fact is 5. asthma control questionnaire is 0. He is not waking up in the night with asthma symptoms. When he wakes up he has no problems. This no shortness of breath or wheezing or albuterol rescue use. He feels good.   OV 03/01/2018  Chief Complaint  Patient presents with  . Follow-up    Pt states he has been doing good since last visit. States he has to clear his throat a lot. Has c/o SOB with exertion.    64 year old male with moderate persistent asthma on [facility]   Last visit August 2018.  Overall asthma has been stable.  Earlier this year he ended up with influenza this was 2 weeks after ventral hernia repair.  In any event he is stable now on [facility].  His asthma control questionnaire 0 out of 5.  Is not waking up in the middle of the night with asthma symptoms when he wakes up he does not have any symptoms.  His activities are not limited.  He experiences very little shortness of breath and is not wheezing at all.  He is only taking his Dulera at night.  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  His health insurance is fully covered.  He smokes an occasional cigar   OV 11/26/2018  Subjective:  Patient ID: Charles Mosley, male , DOB: 12-15-1955 , age 64 y.o. , MRN: [id number] , ADDRESS: [address] [address]   11/26/2018 -   Chief Complaint  Patient presents with  . Follow-up    Pt stated he has been doing good since last visit but states about 4 weeks ago he had a virus to where he was congested, coughing, and sneezing. Pt states he is now feeling  better and has been doing mucinex.     HPI [name] 64 y.o. -returns for routine follow-up.  He tells me that he now works at [employer]  Approximately 3 weeks ago a lot of sick contacts at work including if you got hospitalized for pneumonia.  He then subsequently picked up really bad "crud".  After this he had bad sinus infection with coughing and wheezing.  The cough is pretty intense.  Slowly little bit but but the symptoms have significantly improved and  largely resolved.  A few days ago he even had wheezing but in the last 3 days he has not had any problems including shortness of breath cough or wheezing.  His HCQ shows that he is not waking up in the middle of the night with asthma symptoms.  When he wakes up he does not have any symptoms.  He is not limited in his activities.  He is not short of breath.  He is breathing a little of the time but that was a few days ago.  Not using albuterol inhaler for rescue in the last few days.  Is up-to-date with his respiratory vaccines.  Exhaled nitric oxide today is 19ppb and normal    OV 02/20/2020  Subjective:  Patient ID: Charles Mosley, male , DOB: 1955/10/28 , age 42 y.o. , MRN: [id number] , ADDRESS: [address] [id number]   02/20/2020 -   Chief Complaint  Patient presents with  . Follow-up    Pt states he has been doing good since last visit. States when he runs he has to use his rescue inhaler due to having some tightness in chest.     Moderate persistent asthma follow-up HPI Charles Mosley 64 y.o. -returns for asthma follow-up.  Last seen prior to the onset of the pandemic.  He now works remote for [employer]  He says he is doing really well.  He is taking his albuterol only as needed which is rare.  He takes his Advair 1 puff once daily only.  He said this is working really well for him.  He says 3 times a week he does different exercises including weights, treadmills, elliptical and also bicycle.   He does this for 30-60 minutes each time.  He thinks he does it at a good pace.  He does not get any chest pain or asthma attack. However, in July 2020 -> he tried to run on the street and he did this for a few weeks but every time around the same distance he would get chest tightness that would be improved by albuterol.  He says that this is not cardiac in nature.  He strongly feels it is noncardiac in nature.  This because he has had multiple stress test including one in 2019 that was normal and is documented in the chart.  He is also on Singulair.  He is up-to-date with his respiratory vaccines but is not had his COVID-19 shot.  He says he was waiting to see real world experience before he could have this.  He stated an allergy to software version 1.1 before he decided to take it.  We discussed the fact he is not had any allergies to any injectables or vaccines other than having some side effect from the shingles vaccine.  We said that from the original study cohort more than 9 months of real world experience currently ongoing.  Currently millions of had vaccine especially the Coca-[employer]a or Moderna.  We discussed the common side effects of inflammatory response syndrome.  After this he is more willing.  He is going to look into this.     Asthma Control Panel 02/13/2015  12/25/2016  06/04/2017  03/01/2018  11/26/2018  02/20/2020  Current Med Regimen advair at night + singulair at night singulair every few days and alb prn Dulera. No singulare dulera and allegra dulera at night (URI 3 weeks earlier()  Advair and Singulair  ACQ 5 point- 1 week (wtd avg score) x 2  0 0.2 0.4  0.2  FeNO ppB 24 ppb on 02/13/2015  61ppb x x    FeV1  x x x x    Planned intervention  for visit cotninue advair/singulair - no change in baseline regime  dulera continue dulera at night to continue       ROS - per HPI     has a past medical history of Asthma, COPD (chronic obstructive pulmonary disease) ([place]), Coronary artery  disease, Hypertension, Nephritis, OSA on CPAP, and Skin cancer.   reports that he has been smoking cigars. He has smoked for the past 20.00 years. He has quit using smokeless tobacco.  Past Surgical History:  Procedure Laterality Date  . CORONARY ANGIOPLASTY  2012  . INGUINAL HERNIA REPAIR Left 11/13/2017   Procedure: OPEN LEFT INGUINAL HERNIA REPAIR WITH MESH;  Surgeon: Clovis Riley, MD;  Location: [place];  Service: General;  Laterality: Left;  GENERAL AND TAP BLOCK  . INSERTION OF MESH Left 11/13/2017   Procedure: INSERTION OF MESH LEFT INGUINAL HERNIA;  Surgeon: Clovis Riley, MD;  Location: [place];  Service: General;  Laterality: Left;  GENERAL AND TAP BLOCK  . SKIN BIOPSY      No Known Allergies  Immunization History  Administered Date(s) Administered  . Influenza Split 07/27/2014  . Influenza, High Dose Seasonal PF 07/15/2019  . Influenza, Quadrivalent, Recombinant, Inj, Pf 08/13/2018  . Influenza,inj,Quad PF,6+ Mos 07/27/2016, 07/17/2017  . Pneumococcal Conjugate-13 10/27/2012  . Pneumococcal Polysaccharide-23 10/27/2012    Family History  Problem Relation Age of Onset  . Cancer Mother   . Emphysema Father      Current Outpatient Medications:  .  albuterol (PROVENTIL HFA;VENTOLIN HFA) 108 (90 Base) MCG/ACT inhaler, Inhale 1 puff into the lungs every 6 (six) hours as needed for wheezing or shortness of breath., Disp: 3 Inhaler, Rfl: 0 .  aspirin EC 81 MG tablet, Take 81 mg by mouth daily., Disp: , Rfl:  .  fexofenadine (ALLEGRA) 180 MG tablet, Take 180 mg by mouth daily as needed for allergies or rhinitis., Disp: , Rfl:  .  Fluticasone-Salmeterol (ADVAIR) 250-50 MCG/DOSE AEPB, Inhale 1 puff into the lungs 2 (two) times daily., Disp: , Rfl:  .  montelukast (SINGULAIR) 10 MG tablet, Take 10 mg by mouth daily as needed., Disp: , Rfl:  .  pseudoephedrine (SUDAFED) 30 MG tablet, Take 30 mg by mouth every 4 (four) hours as needed for congestion., Disp: , Rfl:  .   Fluticasone-Salmeterol (ADVAIR) 250-50 MCG/DOSE AEPB, Inhale 1 puff into the lungs every 12 (twelve) hours., Disp: 180 each, Rfl: 3      Objective:   Vitals:   02/20/20 1118  BP: 132/80  Pulse: 67  SpO2: 98%  Weight: 209 lb 6.4 oz (95 kg)  Height: 5\' 6"  (1.676 m)    Estimated body mass index is 33.8 kg/m as calculated from the following:   Height as of this encounter: 5\' 6"  (1.676 m).   Weight as of this encounter: 209 lb 6.4 oz (95 kg).  @WEIGHTCHANGE @  [employer]   02/20/20 1118  Weight: 209 lb 6.4 oz (95 kg)     Physical Exam  -Alert and oriented x3.  Speech normal.  Visceral obesity present clear to auscultation no oral thrush.  Normal heart sounds Pleasant male.  No sinus no clubbing no edema    Assessment:       ICD-10-CM   1. Asthma, moderate persistent, well-controlled  J45.40 Nitric oxide  2.  Vaccine counseling  Z71.89   3. Chest tightness  R07.89        Plan:     Patient Instructions     ICD-10-CM   1. Asthma, moderate persistent, well-controlled  J45.40   2. Vaccine counseling  Z71.89   3. Chest tightness  R07.89     Currently asthma is well controlled and stable Suspect chest tightness is due to asthma - you had negative stress test in 2019    PLAn - respect holding off on repeat cardiologist eval for chest tightness -Continue advair scheduled but ok to take it 1 puff once daily - continue singulair -Use albuterol as needed - recommend pfizer or moderna covid vaccine  - risks, benefits, limitations discussed  Followup - 9 months or sooner; HCQ and exhaled nitric oxide at follow-up -Any respiratory exacerbation symptoms please call us or come sooner      SIGNATURE    Dr. Brand Males, M.D., F.C.C.P,  Pulmonary and Critical Care Medicine Staff Physician, [place] Director - Interstitial Lung Disease  Program  Pulmonary [place] [facility] [place], [place],  [id number]  Pager: [phone], If no answer or between  15:00h - 7:00h: call [phone] Telephone: [phone]  11:56 AM 02/20/2020

## 2020-02-20 NOTE — Patient Instructions (Addendum)
ICD-10-CM   1. Asthma, moderate persistent, well-controlled  J45.40   2. Vaccine counseling  Z71.89   3. Chest tightness  R07.89     Currently asthma is well controlled and stable Suspect chest tightness is due to asthma - you had negative stress test in 2019    PLAn - respect holding off on repeat cardiologist eval for chest tightness -Continue advair scheduled but ok to take it 1 puff once daily - continue singulair -Use albuterol as needed - recommend pfizer or moderna covid vaccine  - risks, benefits, limitations discussed  Followup - 9 months or sooner; HCQ and exhaled nitric oxide at follow-up -Any respiratory exacerbation symptoms please call us or come sooner

## 2020-03-30 ENCOUNTER — Encounter: Payer: Self-pay | Admitting: Gastroenterology

## 2020-05-14 ENCOUNTER — Ambulatory Visit (AMBULATORY_SURGERY_CENTER): Payer: Self-pay | Admitting: *Deleted

## 2020-05-14 ENCOUNTER — Other Ambulatory Visit: Payer: Self-pay

## 2020-05-14 VITALS — Ht 66.0 in | Wt 205.0 lb

## 2020-05-14 DIAGNOSIS — Z1211 Encounter for screening for malignant neoplasm of colon: Secondary | ICD-10-CM

## 2020-05-14 MED ORDER — SUTAB 1479-225-188 MG PO TABS: 24.0000 | ORAL_TABLET | ORAL | 0 refills | Status: DC

## 2020-05-14 NOTE — Progress Notes (Signed)
04-10-2020 comp covid vacc  No egg or soy allergy known to patient  No issues with past sedation with any surgeries or procedures no intubation problems in the past  No diet pills per patient No home 02 use per patient  No blood thinners per patient  Pt denies issues with constipation  No A fib or A flutter  EMMI video to pt or MyChart  COVID 19 guidelines implemented in [facility] today    Due to the COVID-19 pandemic we are asking patients to follow these guidelines. Please only bring one care partner. Please be aware that your care partner may wait in the car in the parking lot or if they feel like they will be too hot to wait in the car, they may wait in the lobby on the 4th floor. All care partners are required to wear a mask the entire time (we do not have any that we can provide them), they need to practice social distancing, and we will do a Covid check for all patient's and care partners when you arrive. Also we will check their temperature and your temperature. If the care partner waits in their car they need to stay in the parking lot the entire time and we will call them on their cell phone when the patient is ready for discharge so they can bring the car to the front of the building. Also all patient's will need to wear a mask into building.

## 2020-05-25 ENCOUNTER — Encounter: Payer: Self-pay | Admitting: Gastroenterology

## 2020-05-25 ENCOUNTER — Other Ambulatory Visit: Payer: Self-pay

## 2020-05-25 ENCOUNTER — Ambulatory Visit (AMBULATORY_SURGERY_CENTER): Payer: No Typology Code available for payment source | Admitting: Gastroenterology

## 2020-05-25 VITALS — BP 140/82 | HR 49 | Temp 98.1°F | Resp 13 | Ht 66.0 in | Wt 205.0 lb

## 2020-05-25 DIAGNOSIS — Z1211 Encounter for screening for malignant neoplasm of colon: Secondary | ICD-10-CM

## 2020-05-25 DIAGNOSIS — D123 Benign neoplasm of transverse colon: Secondary | ICD-10-CM | POA: Diagnosis not present

## 2020-05-25 MED ORDER — SODIUM CHLORIDE 0.9 % IV SOLN: 500.0000 mL | INTRAVENOUS | Status: DC

## 2020-05-25 NOTE — Progress Notes (Signed)
Called to room to assist during endoscopic procedure.  Patient ID and intended procedure confirmed with present staff. Received instructions for my participation in the procedure from the performing physician.  

## 2020-05-25 NOTE — Patient Instructions (Signed)
YOU HAD AN ENDOSCOPIC PROCEDURE TODAY AT THE [facility]:   Refer to the procedure report that was given to you for any specific questions about what was found during the examination.  If the procedure report does not answer your questions, please call your gastroenterologist to clarify.  If you requested that your care partner not be given the details of your procedure findings, then the procedure report has been included in a sealed envelope for you to review at your convenience later.  YOU SHOULD EXPECT: Some feelings of bloating in the abdomen. Passage of more gas than usual.  Walking can help get rid of the air that was put into your GI tract during the procedure and reduce the bloating. If you had a lower endoscopy (such as a colonoscopy or flexible sigmoidoscopy) you may notice spotting of blood in your stool or on the toilet paper. If you underwent a bowel prep for your procedure, you may not have a normal bowel movement for a few days.  Please Note:  You might notice some irritation and congestion in your nose or some drainage.  This is from the oxygen used during your procedure.  There is no need for concern and it should clear up in a day or so.  SYMPTOMS TO REPORT IMMEDIATELY:   Following lower endoscopy (colonoscopy or flexible sigmoidoscopy):  Excessive amounts of blood in the stool  Significant tenderness or worsening of abdominal pains  Swelling of the abdomen that is new, acute  Fever of 100F or higher  For urgent or emergent issues, a gastroenterologist can be reached at any hour by calling [phone]. Do not use MyChart messaging for urgent concerns.    DIET:  We do recommend a small meal at first, but then you may proceed to your regular diet.  Drink plenty of fluids but you should avoid alcoholic beverages for 24 hours.  ACTIVITY:  You should plan to take it easy for the rest of today and you should NOT DRIVE or use heavy machinery until tomorrow (because  of the sedation medicines used during the test).    FOLLOW UP: Our staff will call the number listed on your records 48-72 hours following your procedure to check on you and address any questions or concerns that you may have regarding the information given to you following your procedure. If we do not reach you, we will leave a message.  We will attempt to reach you two times.  During this call, we will ask if you have developed any symptoms of COVID 19. If you develop any symptoms (ie: fever, flu-like symptoms, shortness of breath, cough etc.) before then, please call [phone].  If you test positive for Covid 19 in the 2 weeks post procedure, please call and report this information to us.    If any biopsies were taken you will be contacted by phone or by letter within the next 1-3 weeks.  Please call us at [phone] if you have not heard about the biopsies in 3 weeks.    SIGNATURES/CONFIDENTIALITY: You and/or your care partner have signed paperwork which will be entered into your electronic medical record.  These signatures attest to the fact that that the information above on your After Visit Summary has been reviewed and is understood.  Full responsibility of the confidentiality of this discharge information lies with you and/or your care-partner. 

## 2020-05-25 NOTE — Progress Notes (Signed)
[facility] [place] I have reviewed the patient's medical history in detail and updated the computerized patient record.  

## 2020-05-25 NOTE — Progress Notes (Signed)
To PACU, VSS. Report to [email or web address] 

## 2020-05-25 NOTE — Op Note (Signed)
[facility] Patient Name: Charles Mosley Procedure Date: 05/25/2020 10:47 AM MRN: [id number] Endoscopist: Mallie Mussel L. Loletha Carrow , MD Age: 64 Referring MD:  Date of Birth: 1956/05/02 Gender: Male Account #: [id number] Procedure:                Colonoscopy Indications:              Screening for colorectal malignant neoplasm                            (patient reported no polyps on last colonoscopy >                            [place]) Medicines:                Monitored Anesthesia Care Procedure:                Pre-Anesthesia Assessment:                           - Prior to the procedure, a History and Physical                            was performed, and patient medications and                            allergies were reviewed. The patient's tolerance of                            previous anesthesia was also reviewed. The risks                            and benefits of the procedure and the sedation                            options and risks were discussed with the patient.                            All questions were answered, and informed consent                            was obtained. Prior Anticoagulants: The patient has                            taken no previous anticoagulant or antiplatelet                            agents except for aspirin. ASA Grade Assessment:                            III - A patient with severe systemic disease. After                            reviewing the risks and benefits, the patient was  deemed in satisfactory condition to undergo the                            procedure.                           After obtaining informed consent, the colonoscope                            was passed under direct vision. Throughout the                            procedure, the patient's blood pressure, pulse, and                            oxygen saturations were monitored continuously. The                             Colonoscope was introduced through the anus and                            advanced to the the cecum, identified by                            appendiceal orifice and ileocecal valve. The                            colonoscopy was performed without difficulty. The                            patient tolerated the procedure well. The quality                            of the bowel preparation was good after copious                            irrigation. The ileocecal valve, appendiceal                            orifice, and rectum were photographed. The bowel                            preparation used was Sutab. Scope In: 11:17:33 AM Scope Out: 11:34:10 AM Scope Withdrawal Time: 0 hours 8 minutes 58 seconds  Total Procedure Duration: 0 hours 16 minutes 37 seconds  Findings:                 The perianal and digital rectal examinations were                            normal.                           Many small and large-mouthed diverticula were found  in the entire colon. There was associated                            tortuosity and redundancy.                           A diminutive polyp was found in the transverse                            colon. The polyp was flat. The polyp was removed                            with a cold biopsy forceps. Resection and retrieval                            were complete.                           The exam was otherwise without abnormality on                            direct and retroflexion views. Complications:            No immediate complications. Estimated Blood Loss:     Estimated blood loss was minimal. Impression:               - Diverticulosis in the entire examined colon.                           - One diminutive polyp in the transverse colon,                            removed with a cold biopsy forceps. Resected and                            retrieved.                           - The examination  was otherwise normal on direct                            and retroflexion views. Recommendation:           - Patient has a contact number available for                            emergencies. The signs and symptoms of potential                            delayed complications were discussed with the                            patient. Return to normal activities tomorrow.                            Written discharge instructions were provided to the  patient.                           - Resume previous diet.                           - Continue present medications.                           - Repeat colonoscopy in 5 years for surveillance.                            Suprep or Plenvu with ducolax before and more water                            consumption with prep (due to extensive                            diverticulosis) Mallie Mussel L. Loletha Carrow, MD 05/25/2020 11:42:45 AM This report has been signed electronically.

## 2020-05-29 ENCOUNTER — Encounter: Payer: Self-pay | Admitting: *Deleted

## 2020-05-29 ENCOUNTER — Telehealth: Payer: Self-pay | Admitting: *Deleted

## 2020-05-29 NOTE — Telephone Encounter (Signed)
  Follow up Call-  Call back number 05/25/2020  Post procedure Call Back phone  # [phone]  Permission to leave phone message Yes  Some recent data might be hidden     Patient questions:  Do you have a fever, pain , or abdominal swelling? No. Pain Score  0 *  Have you tolerated food without any problems? Yes.    Have you been able to return to your normal activities? Yes.    Do you have any questions about your discharge instructions: Diet   No. Medications  No. Follow up visit  No.  Do you have questions or concerns about your Care? No.  Actions: * If pain score is 4 or above: No action needed, pain <4.  1. Have you developed a fever since your procedure? no  2.   Have you had an respiratory symptoms (SOB or cough) since your procedure? no  3.   Have you tested positive for COVID 19 since your procedure no  4.   Have you had any family members/close contacts diagnosed with the COVID 19 since your procedure?  no   If yes to any of these questions please route to Joylene John, RN and Erenest Rasher, RN

## 2020-05-29 NOTE — Telephone Encounter (Signed)
Opened in error

## 2020-05-30 ENCOUNTER — Encounter: Payer: Self-pay | Admitting: Gastroenterology

## 2020-12-06 ENCOUNTER — Ambulatory Visit: Payer: No Typology Code available for payment source | Admitting: Internal Medicine

## 2020-12-15 ENCOUNTER — Other Ambulatory Visit (HOSPITAL_COMMUNITY)
Admission: RE | Admit: 2020-12-15 | Discharge: 2020-12-15 | Disposition: A | Discharge status: Home / Self Care | Payer: No Typology Code available for payment source | Source: Ambulatory Visit | Attending: Internal Medicine | Admitting: Internal Medicine

## 2020-12-15 DIAGNOSIS — Z01812 Encounter for preprocedural laboratory examination: Secondary | ICD-10-CM | POA: Diagnosis present

## 2020-12-15 DIAGNOSIS — Z20822 Contact with and (suspected) exposure to covid-19: Secondary | ICD-10-CM | POA: Diagnosis not present

## 2020-12-15 LAB — SARS CORONAVIRUS 2 (TAT 6-24 HRS): SARS Coronavirus 2: NEGATIVE

## 2020-12-18 ENCOUNTER — Other Ambulatory Visit: Payer: Self-pay | Admitting: Internal Medicine

## 2020-12-18 DIAGNOSIS — J4541 Moderate persistent asthma with (acute) exacerbation: Secondary | ICD-10-CM

## 2020-12-19 ENCOUNTER — Encounter: Payer: Self-pay | Admitting: Internal Medicine

## 2020-12-19 ENCOUNTER — Other Ambulatory Visit: Payer: Self-pay

## 2020-12-19 ENCOUNTER — Ambulatory Visit (INDEPENDENT_AMBULATORY_CARE_PROVIDER_SITE_OTHER): Payer: No Typology Code available for payment source | Admitting: Internal Medicine

## 2020-12-19 VITALS — BP 130/80 | HR 74 | Temp 98.5°F | Ht 66.0 in | Wt 215.0 lb

## 2020-12-19 DIAGNOSIS — J4541 Moderate persistent asthma with (acute) exacerbation: Secondary | ICD-10-CM

## 2020-12-19 DIAGNOSIS — Z7185 Encounter for immunization safety counseling: Secondary | ICD-10-CM

## 2020-12-19 LAB — PULMONARY FUNCTION TEST
DL/VA % pred: 142 %
DL/VA: 5.96 ml/min/mmHg/L
DLCO cor % pred: 160 %
DLCO cor: 37.36 ml/min/mmHg
DLCO unc % pred: 160 %
DLCO unc: 37.36 ml/min/mmHg
FEF 25-75 Pre: 1.9 L/sec
FEF2575-%Pred-Pre: 84 %
FEV1-%Pred-Pre: 90 %
FEV1-Pre: 2.61 L
FEV1FVC-%Pred-Pre: 97 %
FEV6-%Pred-Pre: 98 %
FEV6-Pre: 3.59 L
FEV6FVC-%Pred-Pre: 106 %
FVC-%Pred-Pre: 92 %
FVC-Pre: 3.59 L
Pre FEV1/FVC ratio: 73 %
Pre FEV6/FVC Ratio: 100 %

## 2020-12-19 LAB — SARS-COV-2 IGG: SARS-COV-2 IgG: 4.5

## 2020-12-19 MED ORDER — FLUTICASONE-SALMETEROL 250-50 MCG/DOSE IN AEPB: 1.0000 | INHALATION_SPRAY | Freq: Two times a day (BID) | RESPIRATORY_TRACT | 3 refills | Status: DC

## 2020-12-19 MED ORDER — ALBUTEROL SULFATE HFA 108 (90 BASE) MCG/ACT IN AERS: 1.0000 | INHALATION_SPRAY | Freq: Four times a day (QID) | RESPIRATORY_TRACT | 3 refills | Status: DC | PRN

## 2020-12-19 NOTE — Progress Notes (Signed)
IOV 02/13/2015  Chief Complaint  Patient presents with  . :Pulmonary Consult    Self Referral - Pt moved here from [place] and needs to establish with new pulmonary md -Asthma with some copd - Asthma worsened 3 years ago - Occas sob after eating or with increased stress - Denies cough or wheezing  - Pt also uses cpap    65 year-old morbidly obese male. Used to work for [employer] post and [place] today. 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 Wife also relocated here to work for [employer]. He reports a diagnosis of asthma made over 10 years ago. He's been on Advair which she takes only 1 puff at night along with Singulair for the last few years. His last visit with pulmonologist Dr. Meredith Pel in [place] was he says over a year ago. Last prednisone was over a year ago. Last permit function tests and chest x-ray was over a year ago. Limited outside records from Dr. Meredith Pel shows that he had a clear chest x-ray in December 2014. He was also subjected to methacholine challenge test per the results are known. Patient himself does not recollect any of these test results. Currently feels his asthma is extremely well controlled. His last albuterol use was several months ago. On the 5. asthma control test score was 22 showing good control  Specifically in the last 4 weeks he does not feel his asthma is preventing him from getting any of his work done. He admits to shortness of breath 3-6 times a week in the last 4 weeks. But he does not have any other asthma symptoms at this wheezing coughing chest tightness awaking up at night. Also in the last 4 weeks is not uses albuterol for rescue. Overall he feels his asthma is well controlled.  He does have chronic sleep apnea and uses CPAP but he does not have a sleep Dr.  Marcelline Deist main purpose of this visit today is to establish care in [facility] and get refills  His exhaled nitric oxide FeNO  is 24 ppb and shows good control of asthma   OV 12/25/2016  Chief Complaint  Patient presents with  . Follow-up    Pt states overall his breahting feels slightly worse since last OV due to being off medication, pt last seen in 01/2015. Pt denies significant cough and CP/tightness.       65 year old male last seen in April 2016. Since then he says that he lost his job in [employer] and recommended and therefore his health insurance. He now works at [employer] on a part-time basis. He does not have health insurance. Therefore is not taking any inhaled corticosteroids. He only has Singulair the 90s while at I gave him. He says he is able to get it at 3 of cost but still makes it stretch for a whole year. Taking the Singulair only every few to several days. He says on the days he takes Singulair he feels well but has not been feeling poorly. Currently he is reporting lot more symptoms as documented below the asthma control questionnaire. This is one of the good days because he took Singulair last night. This past week is woken up at least a few times at night because of asthma. When he wakes up he feels he has moderate symptoms. He is not limited in his activities because of asthma. He has a moderate amount of shortness of breath because  of asthma and is wheezing a little of the time and using albuterol for rescue at least 3 or 4 times. He still has some difficulty understanding the difference between maintenance inhalers and as rescue inhalers. He is not sure why despite getting the Singulair free of cost is only taking it every few to several days. He's not sure how much of an inhaler would cost him .   no active chest pain or sputum or fever or chills   OV 06/04/2017  Chief Complaint  Patient presents with  . Follow-up    Pt states his breathing is doing well. Pt denies cough, CP/tightness, and f/c/s.    65 year old male with clinical moderate persistent asthma. He returns for follow-up.  He still works at [employer]. He still does not have insurance. He is going to start a new job and he'll start having health insurance in a month. To 6 months. At this point in time he is not on Singulair. He only takes Dulera 2 puffs 2 times daily. He gets this med cost program for free. He says that asthma is well controlled. There is no albuterol use or chest pain or chest tightness. In fact is 5. asthma control questionnaire is 0. He is not waking up in the night with asthma symptoms. When he wakes up he has no problems. This no shortness of breath or wheezing or albuterol rescue use. He feels good.   OV 03/01/2018  Chief Complaint  Patient presents with  . Follow-up    Pt states he has been doing good since last visit. States he has to clear his throat a lot. Has c/o SOB with exertion.    65 year old male with moderate persistent asthma on [facility]   Last visit August 2018.  Overall asthma has been stable.  Earlier this year he ended up with influenza this was 2 weeks after ventral hernia repair.  In any event he is stable now on [facility].  His asthma control questionnaire 0 out of 5.  Is not waking up in the middle of the night with asthma symptoms when he wakes up he does not have any symptoms.  His activities are not limited.  He experiences very little shortness of breath and is not wheezing at all.  He is only taking his Dulera at night.  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  His health insurance is fully covered.  He smokes an occasional cigar   OV 11/26/2018  Subjective:  Patient ID: Charles Mosley, male , DOB: 12-15-1955 , age 64 y.o. , MRN: [id number] , ADDRESS: [address] [address]   11/26/2018 -   Chief Complaint  Patient presents with  . Follow-up    Pt stated he has been doing good since last visit but states about 4 weeks ago he had a virus to where he was congested, coughing, and sneezing. Pt states he is now feeling  better and has been doing mucinex.     HPI Charles Mosley 65 y.o. -returns for routine follow-up.  He tells me that he now works at [employer]  Approximately 3 weeks ago a lot of sick contacts at work including if you got hospitalized for pneumonia.  He then subsequently picked up really bad "crud".  After this he had bad sinus infection with coughing and wheezing.  The cough is pretty intense.  Slowly little bit but but the symptoms have significantly improved and  largely resolved.  A few days ago he even had wheezing but in the last 3 days he has not had any problems including shortness of breath cough or wheezing.  His HCQ shows that he is not waking up in the middle of the night with asthma symptoms.  When he wakes up he does not have any symptoms.  He is not limited in his activities.  He is not short of breath.  He is breathing a little of the time but that was a few days ago.  Not using albuterol inhaler for rescue in the last few days.  Is up-to-date with his respiratory vaccines.  Exhaled nitric oxide today is 19ppb and normal    OV 02/20/2020  Subjective:  Patient ID: Charles Mosley, male , DOB: 1955/10/28 , age 42 y.o. , MRN: [id number] , ADDRESS: [address] [address]   02/20/2020 -   Chief Complaint  Patient presents with  . Follow-up    Pt states he has been doing good since last visit. States when he runs he has to use his rescue inhaler due to having some tightness in chest.     Moderate persistent asthma follow-up HPI Charles Mosley 65 y.o. -returns for asthma follow-up.  Last seen prior to the onset of the pandemic.  He now works remote for [employer]  He says he is doing really well.  He is taking his albuterol only as needed which is rare.  He takes his Advair 1 puff once daily only.  He said this is working really well for him.  He says 3 times a week he does different exercises including weights, treadmills, elliptical and also bicycle.   He does this for 30-60 minutes each time.  He thinks he does it at a good pace.  He does not get any chest pain or asthma attack. However, in July 2020 -> he tried to run on the street and he did this for a few weeks but every time around the same distance he would get chest tightness that would be improved by albuterol.  He says that this is not cardiac in nature.  He strongly feels it is noncardiac in nature.  This because he has had multiple stress test including one in 2019 that was normal and is documented in the chart.  He is also on Singulair.  He is up-to-date with his respiratory vaccines but is not had his COVID-19 shot.  He says he was waiting to see real world experience before he could have this.  He stated an allergy to software version 1.1 before he decided to take it.  We discussed the fact he is not had any allergies to any injectables or vaccines other than having some side effect from the shingles vaccine.  We said that from the original study cohort more than 9 months of real world experience currently ongoing.  Currently millions of had vaccine especially the [employer] or Moderna.  We discussed the common side effects of inflammatory response syndrome.  After this he is more willing.  He is going to look into this.     Asthma Control Panel 02/13/2015  12/25/2016  06/04/2017  03/01/2018  11/26/2018  02/20/2020  Current Med Regimen advair at night + singulair at night singulair every few days and alb prn Dulera. No singulare dulera and allegra dulera at night (URI 3 weeks earlier()  Advair and Singulair  ACQ 5 point- 1 week (wtd avg score) x 2  0 0.2 0.4  0.2  FeNO ppB 24 ppb on 02/13/2015  61ppb x x    FeV1  x x x x    Planned intervention  for visit cotninue advair/singulair - no change in baseline regime  dulera continue dulera at night to continue       ROS - per HPI    OV 12/19/2020  Subjective:  Patient ID: Charles Mosley, male , DOB: 03-May-1956 , age 14 y.o. , MRN:  [id number] , AD[address]ESS: [address] [address] PCP Michael Boston, MD Patient Care Team: Michael Boston, MD as PCP - General (Internal Medicine) Josue Hector, MD as PCP - Cardiology (Cardiology)  This Provider for this visit: Treatment Team:  Attending Provider: Brand Males, MD    12/19/2020 -   Chief Complaint  Patient presents with  . Follow-up    PFT performed today. Pt states he has been doing well since last visit and denies any real complaints.   Moderate persistent asthma follow-up  HPI BRALYNN DONADO 65 y.o. -last seen April 2021.  Continues on Advair 1 puff daily and Singulair.  He says he does not take it he will feel it.  He does elliptical and weights for over 30 minutes on multiple times a day does not have any chest pain.  He says running is difficult for him but otherwise has no problem doing the other exercises.  He said only 2 shots of the mRNA vaccine last one being in June 2021.  He was hesitant to get the third shot.  Then his whole family got COVID-19 around 4 to 6 weeks ago.  He got similar symptoms of the sinus.  He did not test himself but high clinical probability for Covid.  Based on this he is not sure if he should get the third shot of the mRNA vaccine.  ACT control test is showing good control.  Pulmonary function test is normal.   Asthma Control Test ACT Total Score  12/19/2020 24       [facility] Lab Results  Component Value Date   NITRICOXIDE 19 11/26/2018      PFT  PFT Results Latest Ref Rng & Units 12/19/2020  FVC-Pre L 3.59  FVC-Predicted Pre % 92  Pre FEV1/FVC % % 73  FEV1-Pre L 2.61  FEV1-Predicted Pre % 90  DLCO uncorrected ml/min/mmHg 37.36  DLCO UNC% % 160  DLCO corrected ml/min/mmHg 37.36  DLCO COR %Predicted % 160  DLVA Predicted % 142       has a past medical history of Allergy, Arthritis, Asthma, COPD (chronic obstructive pulmonary disease) ([place]), Coronary artery disease, GERD (gastroesophageal  reflux disease), Hypertension, Nephritis, OSA on CPAP, Skin cancer, and Sleep apnea.   reports that he has been smoking cigars. He has smoked for the past 20.00 years. His smokeless tobacco use includes chew.  Past Surgical History:  Procedure Laterality Date  . COLONOSCOPY     10-15 yrs ago- normal -in [place] per pt   . CORONARY ANGIOPLASTY  2012  . FINGER SURGERY     dropped 100 lb WEight   . INGUINAL HERNIA REPAIR Left 11/13/2017   Procedure: OPEN LEFT INGUINAL HERNIA REPAIR WITH MESH;  Surgeon: Clovis Riley, MD;  Location: [facility];  Service: General;  Laterality: Left;  GENERAL AND TAP BLOCK  . INSERTION OF MESH Left 11/13/2017   Procedure: INSERTION OF MESH LEFT INGUINAL HERNIA;  Surgeon: Clovis Riley, MD;  Location: [facility]  OR;  Service: General;  Laterality: Left;  GENERAL AND TAP BLOCK  . SKIN BIOPSY    . WISDOM TOOTH EXTRACTION      No Known Allergies  Immunization History  Administered Date(s) Administered  . Influenza Split 07/27/2014  . Influenza, High Dose Seasonal PF 07/15/2019, 07/27/2020  . Influenza, Quadrivalent, Recombinant, Inj, Pf 08/13/2018  . Influenza,inj,Quad PF,6+ Mos 07/27/2016, 07/17/2017  . Moderna Sars-Covid-2 Vaccination 03/13/2020, 04/10/2020  . Pneumococcal Conjugate-13 10/27/2012  . Pneumococcal Polysaccharide-23 10/27/2012    Family History  Problem Relation Age of Onset  . Cancer Mother        CLL, Lymphoma  . Emphysema Father   . Colon cancer Neg Hx   . Colon polyps Neg Hx   . Esophageal cancer Neg Hx   . Rectal cancer Neg Hx   . Stomach cancer Neg Hx      Current Outpatient Medications:  .  albuterol (VENTOLIN HFA) 108 (90 Base) MCG/ACT inhaler, Inhale 1 puff into the lungs every 6 (six) hours as needed for wheezing or shortness of breath., Disp: 18 g, Rfl: 3 .  aspirin EC 81 MG tablet, Take 81 mg by mouth daily., Disp: , Rfl:  .  raNITIdine HCl (ZANTAC PO), Take 1 tablet by mouth as needed., Disp: , Rfl:  .  fexofenadine (ALLEGRA)  180 MG tablet, Take 180 mg by mouth daily as needed for allergies or rhinitis., Disp: , Rfl:  .  Fluticasone-Salmeterol (ADVAIR) 250-50 MCG/DOSE AEPB, Inhale 1 puff into the lungs 2 (two) times daily., Disp: 180 each, Rfl: 3 .  pseudoephedrine (SUDAFED) 30 MG tablet, Take 30 mg by mouth every 4 (four) hours as needed for congestion. (Patient not taking: No sig reported), Disp: , Rfl:       Objective:   Vitals:   12/19/20 1032  BP: 130/80  Pulse: 74  Temp: 98.5 F (36.9 C)  TempSrc: Temporal  SpO2: 96%  Weight: 215 lb (97.5 kg)  Height: 5\' 6"  (1.676 m)    Estimated body mass index is 34.7 kg/m as calculated from the following:   Height as of this encounter: 5\' 6"  (1.676 m).   Weight as of this encounter: 215 lb (97.5 kg).  @WEIGHTCHANGE @  [employer]   12/19/20 1032  Weight: 215 lb (97.5 kg)     Physical Exam General: No distress. Looks well Neuro: Alert and Oriented x 3. GCS 15. Speech normal Psych: Pleasant Resp:  Barrel Chest - no.  Wheeze - no, Crackles - no, No overt respiratory distress CVS: Normal heart sounds. Murmurs - no Ext: Stigmata of Connective Tissue Disease - no HEENT: Normal upper airway. PEERL +. No post nasal drip        Assessment:       ICD-10-CM   1. Moderate persistent asthma with acute exacerbation  J45.41 SARS-COV-2 IgG  2. Vaccine counseling  Z71.85 SARS-COV-2 IgG       Plan:     Patient Instructions     ICD-10-CM   1. Moderate persistent asthma with acute exacerbation  J45.41   2. Vaccine counseling  Z71.85      Currently asthma is well controlled and stable Noted that you had only 2 doses of mRNA Covid vaccine: Last dose was in June 2021  PLAn - -Continue advair scheduled but ok to take it 1 puff once daily - continue singulair -Use albuterol as needed - check covid IgG -if this is positive it most likely reflects natural immunity for the suspected high  probability infection that he had in end of January 2022  -If IgG  positive we can probably hold off on getting the third shot of the vaccine until the literature changes or the strain changes  Followup - 9 months or sooner; ACT and exhaled nitric oxide at follow-up -Any respiratory exacerbation symptoms please call us or come sooner      SIGNATURE    Dr. Brand Males, M.D., F.C.C.P,  Pulmonary and Critical Care Medicine Staff Physician, [place] Director - Interstitial Lung Disease  Program  P[facility] [place] at [place], [place], [address]  Pager: [phone], If no answer or between  15:00h - 7:00h: call [phone] Telephone: [phone]  11:09 AM 12/19/2020

## 2020-12-19 NOTE — Patient Instructions (Signed)
ICD-10-CM   1. Moderate persistent asthma with acute exacerbation  J45.41   2. Vaccine counseling  Z71.85      Currently asthma is well controlled and stable Noted that you had only 2 doses of mRNA Covid vaccine: Last dose was in June 2021  PLAn - -Continue advair scheduled but ok to take it 1 puff once daily - continue singulair -Use albuterol as needed - check covid IgG -if this is positive it most likely reflects natural immunity for the suspected high probability infection that he had in end of January 2022  -If IgG positive we can probably hold off on getting the third shot of the vaccine until the literature changes or the strain changes  Followup - 9 months or sooner; ACT and exhaled nitric oxide at follow-up -Any respiratory exacerbation symptoms please call us or come sooner

## 2020-12-19 NOTE — Progress Notes (Addendum)
Spirometry/DLCO performed today. 

## 2020-12-20 NOTE — Progress Notes (Signed)
He has some low level IgG against covid. So he has some immediate innate immunity as of this point. If he is reluctant for booster - he should track this level once a month or should just maintain social distancing risk management

## 2020-12-21 ENCOUNTER — Other Ambulatory Visit: Payer: Self-pay | Admitting: *Deleted

## 2020-12-21 MED ORDER — ALBUTEROL SULFATE HFA 108 (90 BASE) MCG/ACT IN AERS: 1.0000 | INHALATION_SPRAY | Freq: Four times a day (QID) | RESPIRATORY_TRACT | 3 refills | Status: DC | PRN

## 2021-09-12 ENCOUNTER — Other Ambulatory Visit: Payer: Self-pay

## 2021-09-12 ENCOUNTER — Encounter: Payer: Self-pay | Admitting: Internal Medicine

## 2021-09-12 ENCOUNTER — Ambulatory Visit (INDEPENDENT_AMBULATORY_CARE_PROVIDER_SITE_OTHER): Payer: No Typology Code available for payment source | Admitting: Internal Medicine

## 2021-09-12 VITALS — BP 128/82 | HR 65 | Ht 66.0 in | Wt 224.2 lb

## 2021-09-12 DIAGNOSIS — J4540 Moderate persistent asthma, uncomplicated: Secondary | ICD-10-CM | POA: Diagnosis not present

## 2021-09-12 DIAGNOSIS — Z7185 Encounter for immunization safety counseling: Secondary | ICD-10-CM

## 2021-09-12 NOTE — Patient Instructions (Addendum)
ICD-10-CM   1. Asthma, moderate persistent, well-controlled  J45.40     2. Vaccine counseling  Z71.85      Asthma  Currently asthma is well controlled and stable with advair use 1-2 puffs daily and without singulair use   Plan -Continue advair scheduled but ok to take it 1 puff once daily -Use albuterol as needed  - CMA oto ensure 3 month supply with [facility] and 4 refills  Vaccine counseling   Noted that you had only 2 doses of mRNA Covid vaccine: Last dose was in June 2021 Likely  natural infection of covid Infection Jan 2022 Current bivalent booster for covid is for [employer] and [employer] - CDC recomends covid booster bivalent but decision to risk protect is yours doing risk v benefit analysis  Followup - 9 months or sooner; ACT score at follow-up -Any respiratory exacerbation symptoms please call us or come sooner

## 2021-09-12 NOTE — Progress Notes (Signed)
IOV 02/13/2015  Chief Complaint  Patient presents with   :Pulmonary Consult    Self Referral - Pt moved here from [place] and needs to establish with new pulmonary md -Asthma with some copd - Asthma worsened 3 years ago - Occas sob after eating or with increased stress - Denies cough or wheezing  - Pt also uses cpap    65 year-old morbidly obese male. Used to work for [employer] post and [place] today. 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 Wife also relocated here to work for [employer]. He reports a diagnosis of asthma made over 10 years ago. He's been on Advair which she takes only 1 puff at night along with Singulair for the last few years. His last visit with pulmonologist Dr. Meredith Mosley in [place] was he says over a year ago. Last prednisone was over a year ago. Last permit function tests and chest x-ray was over a year ago. Limited outside records from Dr. Meredith Mosley shows that he had a clear chest x-ray in December 2014. He was also subjected to methacholine challenge test per the results are known. Patient himself does not recollect any of these test results. Currently feels his asthma is extremely well controlled. His last albuterol use was several months ago. On the 5. asthma control test score was 22 showing good control  Specifically in the last 4 weeks he does not feel his asthma is preventing him from getting any of his work done. He admits to shortness of breath 3-6 times a week in the last 4 weeks. But he does not have any other asthma symptoms at this wheezing coughing chest tightness awaking up at night. Also in the last 4 weeks is not uses albuterol for rescue. Overall he feels his asthma is well controlled.  He does have chronic sleep apnea and uses CPAP but he does not have a sleep Dr.  Marcelline Mosley main purpose of this visit today is to establish care in [place] and get refills  His exhaled nitric oxide FeNO is  24 ppb and shows good control of asthma   OV 12/25/2016  Chief Complaint  Patient presents with   Follow-up    Pt states overall his breahting feels slightly worse since last OV due to being off medication, pt last seen in 01/2015. Pt denies significant cough and CP/tightness.       65 year old male last seen in April 2016. Since then he says that he lost his job in [employer] and recommended and therefore his health insurance. He now works at [employer] on a part-time basis. He does not have health insurance. Therefore is not taking any inhaled corticosteroids. He only has Singulair the 90s while at I gave him. He says he is able to get it at 3 of cost but still makes it stretch for a whole year. Taking the Singulair only every few to several days. He says on the days he takes Singulair he feels well but has not been feeling poorly. Currently he is reporting lot more symptoms as documented below the asthma control questionnaire. This is one of the good days because he took Singulair last night. This past week is woken up at least a few times at night because of asthma. When he wakes up he feels he has moderate symptoms. He is not limited in his activities because of asthma. He has a moderate amount of shortness of breath because of  asthma and is wheezing a little of the time and using albuterol for rescue at least 3 or 4 times. He still has some difficulty understanding the difference between maintenance inhalers and as rescue inhalers. He is not sure why despite getting the Singulair free of cost is only taking it every few to several days. He's not sure how much of an inhaler would cost him .   no active chest pain or sputum or fever or chills   OV 06/04/2017  Chief Complaint  Patient presents with   Follow-up    Pt states his breathing is doing well. Pt denies cough, CP/tightness, and f/c/s.    65 year old male with clinical moderate persistent asthma. He returns for follow-up. He  still works at [employer]. He still does not have insurance. He is going to start a new job and he'll start having health insurance in a month. To 6 months. At this point in time he is not on Singulair. He only takes Dulera 2 puffs 2 times daily. He gets this med cost program for free. He says that asthma is well controlled. There is no albuterol use or chest pain or chest tightness. In fact is 5. asthma control questionnaire is 0. He is not waking up in the night with asthma symptoms. When he wakes up he has no problems. This no shortness of breath or wheezing or albuterol rescue use. He feels good.   OV 03/01/2018  Chief Complaint  Patient presents with   Follow-up    Pt states he has been doing good since last visit. States he has to clear his throat a lot. Has c/o SOB with exertion.    65 year old male with moderate persistent asthma on [facility]   Last visit August 2018.  Overall asthma has been stable.  Earlier this year he ended up with influenza this was 2 weeks after ventral hernia repair.  In any event he is stable now on [facility].  His asthma control questionnaire 0 out of 5.  Is not waking up in the middle of the night with asthma symptoms when he wakes up he does not have any symptoms.  His activities are not limited.  He experiences very little shortness of breath and is not wheezing at all.  He is only taking his Dulera at night.  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  His health insurance is fully covered.  He smokes an occasional cigar   OV 11/26/2018  Subjective:  Patient ID: Charles Mosley, male , DOB: [birth year] , age 65 y.o. , MRN: [id number] , AD[address]ESS: [address] [address]   11/26/2018 -   Chief Complaint  Patient presents with   Follow-up    Pt stated he has been doing good since last visit but states about 4 weeks ago he had a virus to where he was congested, coughing, and sneezing. Pt states he is now feeling better  and has been doing mucinex.     HPI Charles Mosley 65 y.o. -returns for routine follow-up.  He tells me that he now works at [employer]  Approximately 3 weeks ago a lot of sick contacts at work including if you got hospitalized for pneumonia.  He then subsequently picked up really bad "crud".  After this he had bad sinus infection with coughing and wheezing.  The cough is pretty intense.  Slowly little bit but but the symptoms have significantly improved and largely  resolved.  A few days ago he even had wheezing but in the last 3 days he has not had any problems including shortness of breath cough or wheezing.  His HCQ shows that he is not waking up in the middle of the night with asthma symptoms.  When he wakes up he does not have any symptoms.  He is not limited in his activities.  He is not short of breath.  He is breathing a little of the time but that was a few days ago.  Not using albuterol inhaler for rescue in the last few days.  Is up-to-date with his respiratory vaccines.  Exhaled nitric oxide today is 19ppb and normal    OV 02/20/2020  Subjective:  Patient ID: Charles Mosley, male , DOB: 1956-02-16 , age 89 y.o. , MRN: [id number] , ADDRESS: [address] [address]   02/20/2020 -   Chief Complaint  Patient presents with   Follow-up    Pt states he has been doing good since last visit. States when he runs he has to use his rescue inhaler due to having some tightness in chest.     Moderate persistent asthma follow-up HPI Charles Mosley 65 y.o. -returns for asthma follow-up.  Last seen prior to the onset of the pandemic.  He now works remote for [employer]  He says he is doing really well.  He is taking his albuterol only as needed which is rare.  He takes his Advair 1 puff once daily only.  He said this is working really well for him.  He says 3 times a week he does different exercises including weights, treadmills, elliptical and also bicycle.  He  does this for 30-60 minutes each time.  He thinks he does it at a good pace.  He does not get any chest pain or asthma attack. However, in July 2020 -> he tried to run on the street and he did this for a few weeks but every time around the same distance he would get chest tightness that would be improved by albuterol.  He says that this is not cardiac in nature.  He strongly feels it is noncardiac in nature.  This because he has had multiple stress test including one in 2019 that was normal and is documented in the chart.  He is also on Singulair.  He is up-to-date with his respiratory vaccines but is not had his COVID-19 shot.  He says he was waiting to see real world experience before he could have this.  He stated an allergy to software version 1.1 before he decided to take it.  We discussed the fact he is not had any allergies to any injectables or vaccines other than having some side effect from the shingles vaccine.  We said that from the original study cohort more than 9 months of real world experience currently ongoing.  Currently millions of had vaccine especially the [employer] or Moderna.  We discussed the common side effects of inflammatory response syndrome.  After this he is more willing.  He is going to look into this.      ROS - per HPI    OV 12/19/2020  Subjective:  Patient ID: Charles Mosley, male , DOB: Dec 12, 1955 , age 58 y.o. , MRN: [id number] , AD[address]ESS: [address] [address] PCP Michael Boston, MD Patient Care Team: Michael Boston, MD as PCP - General (Internal Medicine) Josue Hector, MD as  PCP - Cardiology (Cardiology)  This Provider for this visit: Treatment Team:  Attending Provider: Brand Males, MD    12/19/2020 -   Chief Complaint  Patient presents with   Follow-up    PFT performed today. Pt states he has been doing well since last visit and denies any real complaints.   Moderate persistent asthma follow-up  HPI RONDEL EPISCOPO  65 y.o. -last seen April 2021.  Continues on Advair 1 puff daily and Singulair.  He says he does not take it he will feel it.  He does elliptical and weights for over 30 minutes on multiple times a day does not have any chest pain.  He says running is difficult for him but otherwise has no problem doing the other exercises.  He said only 2 shots of the mRNA vaccine last one being in June 2021.  He was hesitant to get the third shot.  Then his whole family got COVID-19 around 4 to 6 weeks ago.  He got similar symptoms of the sinus.  He did not test himself but high clinical probability for Covid.  Based on this he is not sure if he should get the third shot of the mRNA vaccine.  ACT control test is showing good control.  Pulmonary function test is normal.   Asthma Control Test ACT Total Score  12/19/2020 24       [facility] Lab Results  Component Value Date   NITRICOXIDE 19 11/26/2018      OV 09/12/2021  Subjective:  Patient ID: Charles Mosley, male , DOB: 22-Apr-1956 , age 37 y.o. , MRN: [id number] , [address]: [address] [address] PCP Michael Boston, MD Patient Care Team: Michael Boston, MD as PCP - General (Internal Medicine) Josue Hector, MD as PCP - Cardiology (Cardiology)  This Provider for this visit: Treatment Team:  Attending Provider: Brand Males, MD    09/12/2021 -   Chief Complaint  Patient presents with   Follow-up    No new concerns     HPI Charles Mosley 65 y.o. -returns for moderate persistent asthma follow-up.  He is on Advair.  He taking Advair 1-2 puffs daily.  Some days 2 puffs some days 1 puff.  He is not taking Singulair.  He has had his flu shot.  His asthma is well controlled ACT score is 24.  His nitric oxide test is normal.  He continues to work for [employer].  He is happy with the job.  His last COVID-vaccine was in June 2021.  He believes strongly that in January 2022 he had COVID.  His IgG was only  slightly positive at that time.  Which could have reflected the vaccine or natural infection.  He says he thinks he has COVID because his family was sick and he had similar symptoms.  His family had confirmed COVID.  He has not had bivValent mRNA booster.  He is reflecting on it.      Asthma Control Panel 02/13/2015  12/25/2016  06/04/2017  03/01/2018  11/26/2018  02/20/2020 09/12/2021   Current Med Regimen advair at night + singulair at night singulair every few days and alb prn Dulera. No singulare dulera and allegra dulera at night (URI 3 weeks earlier()  Advair and Singulair   ACQ 5 point- 1 week (wtd avg score) x 2 0 0.2 0.4  0.2 24 (ACT)  FeNO ppB 24 ppb on 02/13/2015  61ppb x x   17  ppbc  FeV1  x x x x     Planned intervention  for visit cotninue advair/singulair - no change in baseline regime  dulera continue dulera at night to continue      Asthma Control Test ACT Total Score  09/12/2021 24  12/19/2020 24      PFT  PFT Results Latest Ref Rng & Units 12/19/2020  FVC-Pre L 3.59  FVC-Predicted Pre % 92  Pre FEV1/FVC % % 73  FEV1-Pre L 2.61  FEV1-Predicted Pre % 90  DLCO uncorrected ml/min/mmHg 37.36  DLCO UNC% % 160  DLCO corrected ml/min/mmHg 37.36  DLCO COR %Predicted % 160  DLVA Predicted % 142       has a past medical history of Allergy, Arthritis, Asthma, COPD (chronic obstructive pulmonary disease) ([place]), Coronary artery disease, GERD (gastroesophageal reflux disease), Hypertension, Nephritis, OSA on CPAP, Skin cancer, and Sleep apnea.   reports that he has been smoking cigars. His smokeless tobacco use includes chew.  Past Surgical History:  Procedure Laterality Date   COLONOSCOPY     10-15 yrs ago- normal -in [place] per pt    CORONARY ANGIOPLASTY  2012   FINGER SURGERY     dropped 100 lb WEight    INGUINAL HERNIA REPAIR Left 11/13/2017   Procedure: OPEN LEFT INGUINAL HERNIA REPAIR WITH MESH;  Surgeon: Clovis Riley, MD;  Location: [place];  Service: General;   Laterality: Left;  GENERAL AND TAP BLOCK   INSERTION OF MESH Left 11/13/2017   Procedure: INSERTION OF MESH LEFT INGUINAL HERNIA;  Surgeon: Clovis Riley, MD;  Location: [place];  Service: General;  Laterality: Left;  GENERAL AND TAP BLOCK   SKIN BIOPSY     WISDOM TOOTH EXTRACTION      No Known Allergies  Immunization History  Administered Date(s) Administered   Influenza Split 07/27/2014   Influenza, High Dose Seasonal PF 07/15/2019, 07/27/2020   Influenza, Quadrivalent, Recombinant, Inj, Pf 08/13/2018   Influenza,inj,Quad PF,6+ Mos 07/27/2016, 07/17/2017   Moderna Sars-Covid-2 Vaccination 03/13/2020, 04/10/2020   Pneumococcal Conjugate-13 10/27/2012   Pneumococcal Polysaccharide-23 10/27/2012    Family History  Problem Relation Age of Onset   Cancer Mother        CLL, Lymphoma   Emphysema Father    Colon cancer Neg Hx    Colon polyps Neg Hx    Esophageal cancer Neg Hx    Rectal cancer Neg Hx    Stomach cancer Neg Hx      Current Outpatient Medications:    albuterol (VENTOLIN HFA) 108 (90 Base) MCG/ACT inhaler, Inhale 1 puff into the lungs every 6 (six) hours as needed for wheezing or shortness of breath., Disp: 18 g, Rfl: 3   aspirin EC 81 MG tablet, Take 81 mg by mouth daily., Disp: , Rfl:    fexofenadine (ALLEGRA) 180 MG tablet, Take 180 mg by mouth daily as needed for allergies or rhinitis., Disp: , Rfl:    Fluticasone-Salmeterol (ADVAIR) 250-50 MCG/DOSE AEPB, Inhale 1 puff into the lungs 2 (two) times daily., Disp: 180 each, Rfl: 3   pseudoephedrine (SUDAFED) 30 MG tablet, Take 30 mg by mouth every 4 (four) hours as needed for congestion., Disp: , Rfl:    raNITIdine HCl (ZANTAC PO), Take 1 tablet by mouth as needed., Disp: , Rfl:    valsartan-hydrochlorothiazide (DIOVAN-HCT) 160-25 MG tablet, Take 1 tablet by mouth daily., Disp: , Rfl:       Objective:   Vitals:   09/12/21 1542  BP: 128/82  Pulse: 65  SpO2: 99%  Weight: 224 lb 3.2 oz (101.7 kg)  Height: 5'  6" (1.676 m)    Estimated body mass index is 36.19 kg/m as calculated from the following:   Height as of this encounter: 5\' 6"  (1.676 m).   Weight as of this encounter: 224 lb 3.2 oz (101.7 kg).  @WEIGHTCHANGE @  Filed Weights   09/12/21 1542  Weight: 224 lb 3.2 oz (101.7 kg)     Physical Exam   General: No distress. Looks well Neuro: Alert and Oriented x 3. GCS 15. Speech normal Psych: Pleasant Resp:  Barrel Chest - no.  Wheeze - no, Crackles - no, No overt respiratory distress CVS: Normal heart sounds. Murmurs - no Ext: Stigmata of Connective Tissue Disease - no HEENT: Normal upper airway. PEERL +. No post nasal drip        Assessment:       ICD-10-CM   1. Asthma, moderate persistent, well-controlled  J45.40     2. Vaccine counseling  Z71.85          Plan:     Patient Instructions     ICD-10-CM   1. Asthma, moderate persistent, well-controlled  J45.40     2. Vaccine counseling  Z71.85      Asthma  Currently asthma is well controlled and stable with advair use 1-2 puffs daily and without singulair use   Plan -Continue advair scheduled but ok to take it 1 puff once daily -Use albuterol as needed  - CMA oto ensure 3 month supply with [facility] and 4 refills  Vaccine counseling   Noted that you had only 2 doses of mRNA Covid vaccine: Last dose was in June 2021 Likely  natural infection of covid Infection Jan 2022 Current bivalent booster for covid is for [employer] and Omicron-2  PLAn - CDC recomends covid booster bivalent but decision to risk protect is yours doing risk v benefit analysis  Followup - 9 months or sooner; ACT score at follow-up -Any respiratory exacerbation symptoms please call us or come sooner    SIGNATURE    Dr. Brand Males, M.D., F.C.C.P,  Pulmonary and Critical Care Medicine Staff Physician, [place] [facility] Director - Interstitial Lung Disease  Program  Pulmonary [place] at  [place], [place], [address]  Pager: [phone], If no answer or between  15:00h - 7:00h: call [phone] Telephone: [phone]  4:17 PM 09/12/2021

## 2021-10-04 ENCOUNTER — Other Ambulatory Visit: Payer: Self-pay | Admitting: Internal Medicine

## 2022-02-09 ENCOUNTER — Other Ambulatory Visit: Payer: Self-pay | Admitting: Internal Medicine

## 2022-07-10 ENCOUNTER — Encounter: Payer: Self-pay | Admitting: Internal Medicine

## 2022-07-10 ENCOUNTER — Ambulatory Visit (INDEPENDENT_AMBULATORY_CARE_PROVIDER_SITE_OTHER): Payer: No Typology Code available for payment source | Admitting: Internal Medicine

## 2022-07-10 VITALS — BP 122/80 | HR 82 | Temp 98.1°F | Ht 66.0 in | Wt 221.8 lb

## 2022-07-10 DIAGNOSIS — J4540 Moderate persistent asthma, uncomplicated: Secondary | ICD-10-CM

## 2022-07-10 DIAGNOSIS — Z7185 Encounter for immunization safety counseling: Secondary | ICD-10-CM | POA: Diagnosis not present

## 2022-07-10 DIAGNOSIS — Z23 Encounter for immunization: Secondary | ICD-10-CM | POA: Diagnosis not present

## 2022-07-10 MED ORDER — FLUTICASONE-SALMETEROL 250-50 MCG/ACT IN AEPB: 1.0000 | INHALATION_SPRAY | Freq: Two times a day (BID) | RESPIRATORY_TRACT | 3 refills | Status: DC

## 2022-07-10 MED ORDER — ALBUTEROL SULFATE HFA 108 (90 BASE) MCG/ACT IN AERS: INHALATION_SPRAY | RESPIRATORY_TRACT | 3 refills | Status: DC

## 2022-07-10 NOTE — Patient Instructions (Addendum)
ICD-10-CM   1. Asthma, moderate persistent, well-controlled  J45.40     2. Vaccine counseling  Z71.85      Asthma  Currently asthma is well controlled and stable with advair use 1-2 puffs daily and without singulair use   Plan -Continue advair scheduled but ok to take it 1 puff once daily -Use albuterol as needed  - CMA 07/10/2022  oto ensure 3 month supply with [facility] and 4 refills  Vaccine counseling   - recommend high dose flu shot 07/10/2022 - recommend RSV vaccine and covid mRNA booster later In fall 2023   Followup - 9 months or sooner; ACT score at follow-up -Any respiratory exacerbation symptoms please call us or come sooner

## 2022-07-10 NOTE — Progress Notes (Signed)
IOV 02/13/2015  Chief Complaint  Patient presents with   :Pulmonary Consult    Self Referral - Pt moved here from [place] and needs to establish with new pulmonary md -Asthma with some copd - Asthma worsened 3 years ago - Occas sob after eating or with increased stress - Denies cough or wheezing  - Pt also uses cpap    66 year-old morbidly obese male. Used to work for [employer] post and [place] today. 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 Wife also relocated here to work for [employer]. He reports a diagnosis of asthma made over 10 years ago. He's been on Advair which she takes only 1 puff at night along with Singulair for the last few years. His last visit with pulmonologist Dr. Meredith Pel in [place] was he says over a year ago. Last prednisone was over a year ago. Last permit function tests and chest x-ray was over a year ago. Limited outside records from Dr. Meredith Pel shows that he had a clear chest x-ray in December 2014. He was also subjected to methacholine challenge test per the results are known. Patient himself does not recollect any of these test results. Currently feels his asthma is extremely well controlled. His last albuterol use was several months ago. On the 5. asthma control test score was 22 showing good control  Specifically in the last 4 weeks he does not feel his asthma is preventing him from getting any of his work done. He admits to shortness of breath 3-6 times a week in the last 4 weeks. But he does not have any other asthma symptoms at this wheezing coughing chest tightness awaking up at night. Also in the last 4 weeks is not uses albuterol for rescue. Overall he feels his asthma is well controlled.  He does have chronic sleep apnea and uses CPAP but he does not have a sleep Dr.  Marcelline Deist main purpose of this visit today is to establish care in [place] and get refills  His exhaled nitric oxide  FeNO is 24 ppb and shows good control of asthma   OV 12/25/2016  Chief Complaint  Patient presents with   Follow-up    Pt states overall his breahting feels slightly worse since last OV due to being off medication, pt last seen in 01/2015. Pt denies significant cough and CP/tightness.       66 year old male last seen in April 2016. Since then he says that he lost his job in [employer] and recommended and therefore his health insurance. He now works at [employer] on a part-time basis. He does not have health insurance. Therefore is not taking any inhaled corticosteroids. He only has Singulair the 90s while at I gave him. He says he is able to get it at 3 of cost but still makes it stretch for a whole year. Taking the Singulair only every few to several days. He says on the days he takes Singulair he feels well but has not been feeling poorly. Currently he is reporting lot more symptoms as documented below the asthma control questionnaire. This is one of the good days because he took Singulair last night. This past week is woken up at least a few times at night because of asthma. When he wakes up he feels he has moderate symptoms. He is not limited in his activities because of asthma. He has a moderate amount of shortness of  breath because of asthma and is wheezing a little of the time and using albuterol for rescue at least 3 or 4 times. He still has some difficulty understanding the difference between maintenance inhalers and as rescue inhalers. He is not sure why despite getting the Singulair free of cost is only taking it every few to several days. He's not sure how much of an inhaler would cost him .   no active chest pain or sputum or fever or chills   OV 06/04/2017  Chief Complaint  Patient presents with   Follow-up    Pt states his breathing is doing well. Pt denies cough, CP/tightness, and f/c/s.    66 year old male with clinical moderate persistent asthma. He returns for  follow-up. He still works at [employer]. He still does not have insurance. He is going to start a new job and he'll start having health insurance in a month. To 6 months. At this point in time he is not on Singulair. He only takes Dulera 2 puffs 2 times daily. He gets this med cost program for free. He says that asthma is well controlled. There is no albuterol use or chest pain or chest tightness. In fact is 5. asthma control questionnaire is 0. He is not waking up in the night with asthma symptoms. When he wakes up he has no problems. This no shortness of breath or wheezing or albuterol rescue use. He feels good.   OV 03/01/2018  Chief Complaint  Patient presents with   Follow-up    Pt states he has been doing good since last visit. States he has to clear his throat a lot. Has c/o SOB with exertion.    66 year old male with moderate persistent asthma on [employer]   Last visit August 2018.  Overall asthma has been stable.  Earlier this year he ended up with influenza this was 2 weeks after ventral hernia repair.  In any event he is stable now on [facility].  His asthma control questionnaire 0 out of 5.  Is not waking up in the middle of the night with asthma symptoms when he wakes up he does not have any symptoms.  His activities are not limited.  He experiences very little shortness of breath and is not wheezing at all.  He is only taking his Dulera at night.  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  His health insurance is fully covered.  He smokes an occasional cigar   OV 11/26/2018  Subjective:  Patient ID: Charles Mosley, male , DOB: 03/27/1956 , age 52 y.o. , MRN: [id number] , ADDRESS: [address] [address]   11/26/2018 -   Chief Complaint  Patient presents with   Follow-up    Pt stated he has been doing good since last visit but states about 4 weeks ago he had a virus to where he was congested, coughing, and sneezing. Pt states he is now  feeling better and has been doing mucinex.     HPI TACUMA GRAFFAM 66 y.o. -returns for routine follow-up.  He tells me that he now works at [employer]  Approximately 3 weeks ago a lot of sick contacts at work including if you got hospitalized for pneumonia.  He then subsequently picked up really bad "crud".  After this he had bad sinus infection with coughing and wheezing.  The cough is pretty intense.  Slowly little bit but but the symptoms have significantly  improved and largely resolved.  A few days ago he even had wheezing but in the last 3 days he has not had any problems including shortness of breath cough or wheezing.  His HCQ shows that he is not waking up in the middle of the night with asthma symptoms.  When he wakes up he does not have any symptoms.  He is not limited in his activities.  He is not short of breath.  He is breathing a little of the time but that was a few days ago.  Not using albuterol inhaler for rescue in the last few days.  Is up-to-date with his respiratory vaccines.  Exhaled nitric oxide today is 19ppb and normal    OV 02/20/2020  Subjective:  Patient ID: Charles Mosley, male , DOB: 1956-01-27 , age 56 y.o. , MRN: [id number] , AD[address]ESS: [address] [address]   02/20/2020 -   Chief Complaint  Patient presents with   Follow-up    Pt states he has been doing good since last visit. States when he runs he has to use his rescue inhaler due to having some tightness in chest.     Moderate persistent asthma follow-up HPI Charles Mosley 66 y.o. -returns for asthma follow-up.  Last seen prior to the onset of the pandemic.  He now works remote for [employer]  He says he is doing really well.  He is taking his albuterol only as needed which is rare.  He takes his Advair 1 puff once daily only.  He said this is working really well for him.  He says 3 times a week he does different exercises including weights, treadmills, elliptical and also  bicycle.  He does this for 30-60 minutes each time.  He thinks he does it at a good pace.  He does not get any chest pain or asthma attack. However, in July 2020 -> he tried to run on the street and he did this for a few weeks but every time around the same distance he would get chest tightness that would be improved by albuterol.  He says that this is not cardiac in nature.  He strongly feels it is noncardiac in nature.  This because he has had multiple stress test including one in 2019 that was normal and is documented in the chart.  He is also on Singulair.  He is up-to-date with his respiratory vaccines but is not had his COVID-19 shot.  He says he was waiting to see real world experience before he could have this.  He stated an allergy to software version 1.1 before he decided to take it.  We discussed the fact he is not had any allergies to any injectables or vaccines other than having some side effect from the shingles vaccine.  We said that from the original study cohort more than 9 months of real world experience currently ongoing.  Currently millions of had vaccine especially the [employer] or Moderna.  We discussed the common side effects of inflammatory response syndrome.  After this he is more willing.  He is going to look into this.      ROS - per HPI    OV 12/19/2020  Subjective:  Patient ID: Charles Mosley, male , DOB: October 04, 1956 , age 61 y.o. , MRN: [id number] , AD[address]ESS: [address] [address] PCP Michael Boston, MD Patient Care Team: Michael Boston, MD as PCP - General (Internal Medicine) Jenkins Rouge  C, MD as PCP - Cardiology (Cardiology)  This Provider for this visit: Treatment Team:  Attending Provider: Brand Males, MD    12/19/2020 -   Chief Complaint  Patient presents with   Follow-up    PFT performed today. Pt states he has been doing well since last visit and denies any real complaints.   Moderate persistent asthma  follow-up  HPI ELDWIN VOLKOV 66 y.o. -last seen April 2021.  Continues on Advair 1 puff daily and Singulair.  He says he does not take it he will feel it.  He does elliptical and weights for over 30 minutes on multiple times a day does not have any chest pain.  He says running is difficult for him but otherwise has no problem doing the other exercises.  He said only 2 shots of the mRNA vaccine last one being in June 2021.  He was hesitant to get the third shot.  Then his whole family got COVID-19 around 4 to 6 weeks ago.  He got similar symptoms of the sinus.  He did not test himself but high clinical probability for Covid.  Based on this he is not sure if he should get the third shot of the mRNA vaccine.  ACT control test is showing good control.  Pulmonary function test is normal.   Asthma Control Test ACT Total Score  12/19/2020 24       [facility] Lab Results  Component Value Date   NITRICOXIDE 19 11/26/2018      OV 09/12/2021  Subjective:  Patient ID: Charles Mosley, male , DOB: Oct 17, 1956 , age 77 y.o. , MRN: [id number] , [address]ESS: [address] [address] PCP Michael Boston, MD Patient Care Team: Michael Boston, MD as PCP - General (Internal Medicine) Josue Hector, MD as PCP - Cardiology (Cardiology)  This Provider for this visit: Treatment Team:  Attending Provider: Brand Males, MD    09/12/2021 -   Chief Complaint  Patient presents with   Follow-up    No new concerns     HPI Charles Mosley 66 y.o. -returns for moderate persistent asthma follow-up.  He is on Advair.  He taking Advair 1-2 puffs daily.  Some days 2 puffs some days 1 puff.  He is not taking Singulair.  He has had his flu shot.  His asthma is well controlled ACT score is 24.  His nitric oxide test is normal.  He continues to work for [employer].  He is happy with the job.  His last COVID-vaccine was in June 2021.  He believes strongly that in January 2022  he had COVID.  His IgG was only slightly positive at that time.  Which could have reflected the vaccine or natural infection.  He says he thinks he has COVID because his family was sick and he had similar symptoms.  His family had confirmed COVID.  He has not had bivValent mRNA booster.  He is reflecting on it.    OV 07/10/2022  Subjective:  Patient ID: Charles Mosley, male , DOB: 27-Mar-1956 , age 18 y.o. , MRN: [id number] , AD[address]ESS: [address] [address] PCP Michael Boston, MD Patient Care Team: Michael Boston, MD as PCP - General (Internal Medicine) Josue Hector, MD as PCP - Cardiology (Cardiology)  This Provider for this visit: Treatment Team:  Attending Provider: Brand Males, MD    07/10/2022 -   Chief Complaint  Patient presents  with   Follow-up    Pt states he has been doing okay since last visit.     HPI BROADY LAFOY 66 y.o. -presents for follow-up.  He is compliant with his Advair doing really well.  He is going to run out of his Advair and albuterol and he wants a refill.  He continues his daily exercise.  No respiratory infections no health issues in the interim.  We talked about various vaccines.  We discussed the virus RSV in more detail.  He did not have the flu shot today.      Asthma Control Panel 02/13/2015  12/25/2016  06/04/2017  03/01/2018  11/26/2018  02/20/2020 09/12/2021   Current Med Regimen advair at night + singulair at night singulair every few days and alb prn Dulera. No singulare dulera and allegra dulera at night (URI 3 weeks earlier()  Advair and Singulair   ACQ 5 point- 1 week (wtd avg score) x 2 0 0.2 0.4  0.2 24 (ACT)  FeNO ppB 24 ppb on 02/13/2015  61ppb x x   17 ppbc  FeV1  x x x x     Planned intervention  for visit cotninue advair/singulair - no change in baseline regime  dulera continue dulera at night to continue        Asthma Control Test ACT Total Score  07/10/2022 10:05 AM 24  09/12/2021  3:51 PM  24  12/19/2020 10:30 AM 24    Lab Results  Component Value Date   NITRICOXIDE 19 11/26/2018     PFT     Latest Ref Rng & Units 12/19/2020    8:42 AM  PFT Results  FVC-Pre L 3.59   FVC-Predicted Pre % 92   Pre FEV1/FVC % % 73   FEV1-Pre L 2.61   FEV1-Predicted Pre % 90   DLCO uncorrected ml/min/mmHg 37.36   DLCO UNC% % 160   DLCO corrected ml/min/mmHg 37.36   DLCO COR %Predicted % 160   DLVA Predicted % 142        has a past medical history of Allergy, Arthritis, Asthma, COPD (chronic obstructive pulmonary disease) ([place]), Coronary artery disease, GERD (gastroesophageal reflux disease), Hypertension, Nephritis, OSA on CPAP, Skin cancer, and Sleep apnea.   reports that he has been smoking cigars. His smokeless tobacco use includes chew.  Past Surgical History:  Procedure Laterality Date   COLONOSCOPY     10-15 yrs ago- normal -in [place] per pt    CORONARY ANGIOPLASTY  2012   FINGER SURGERY     dropped 100 lb WEight    INGUINAL HERNIA REPAIR Left 11/13/2017   Procedure: OPEN LEFT INGUINAL HERNIA REPAIR WITH MESH;  Surgeon: Clovis Riley, MD;  Location: [facility];  Service: General;  Laterality: Left;  GENERAL AND TAP BLOCK   INSERTION OF MESH Left 11/13/2017   Procedure: INSERTION OF MESH LEFT INGUINAL HERNIA;  Surgeon: Clovis Riley, MD;  Location: [facility];  Service: General;  Laterality: Left;  GENERAL AND TAP BLOCK   SKIN BIOPSY     WISDOM TOOTH EXTRACTION      No Known Allergies  Immunization History  Administered Date(s) Administered   Influenza Split 07/27/2014   Influenza, High Dose Seasonal PF 07/15/2019, 07/27/2020   Influenza, Quadrivalent, Recombinant, Inj, Pf 08/13/2018   Influenza,inj,Quad PF,6+ Mos 07/27/2016, 07/17/2017   Moderna Sars-Covid-2 Vaccination 03/13/2020, 04/10/2020   Pneumococcal Conjugate-13 10/27/2012   Pneumococcal Polysaccharide-23 10/27/2012    Family History  Problem Relation Age of Onset  Cancer Mother        CLL, Lymphoma    Emphysema Father    Colon cancer Neg Hx    Colon polyps Neg Hx    Esophageal cancer Neg Hx    Rectal cancer Neg Hx    Stomach cancer Neg Hx      Current Outpatient Medications:    ADVAIR DISKUS 250-50 MCG/ACT AEPB, USE 1 INHALATION BY MOUTH  TWICE DAILY, Disp: 180 each, Rfl: 3   albuterol (VENTOLIN HFA) 108 (90 Base) MCG/ACT inhaler, USE 1 INHALATION BY MOUTH  EVERY 6 HOURS AS NEEDED FOR WHEEZING OR SHORTNESS OF  BREATH, Disp: 13.4 g, Rfl: 10   aspirin EC 81 MG tablet, Take 81 mg by mouth daily., Disp: , Rfl:    fexofenadine (ALLEGRA) 180 MG tablet, Take 180 mg by mouth daily as needed for allergies or rhinitis., Disp: , Rfl:    pseudoephedrine (SUDAFED) 30 MG tablet, Take 30 mg by mouth every 4 (four) hours as needed for congestion., Disp: , Rfl:    raNITIdine HCl (ZANTAC PO), Take 1 tablet by mouth as needed., Disp: , Rfl:    valsartan-hydrochlorothiazide (DIOVAN-HCT) 160-25 MG tablet, Take 1 tablet by mouth daily., Disp: , Rfl:       Objective:   Vitals:   07/10/22 1007  BP: 122/80  Pulse: 82  Temp: 98.1 F (36.7 C)  TempSrc: Oral  SpO2: 96%  Weight: 221 lb 12.8 oz (100.6 kg)  Height: '5\' 6"'$  (1.676 m)    Estimated body mass index is 35.8 kg/m as calculated from the following:   Height as of this encounter: '5\' 6"'$  (1.676 m).   Weight as of this encounter: 221 lb 12.8 oz (100.6 kg).  '@WEIGHTCHANGE'$ @  [employer]   07/10/22 1007  Weight: 221 lb 12.8 oz (100.6 kg)     Physical Exam    General: No distress. Looks well Neuro: Alert and Oriented x 3. GCS 15. Speech normal Psych: Pleasant Resp:  Barrel Chest - no.  Wheeze - no, Crackles - no, No overt respiratory distress CVS: Normal heart sounds. Murmurs - no Ext: Stigmata of Connective Tissue Disease - no HEENT: Normal upper airway. PEERL +. No post nasal drip        Assessment:       ICD-10-CM   1. Asthma, moderate persistent, well-controlled  J45.40     2. Vaccine counseling  Z71.85          Plan:      Patient Instructions     ICD-10-CM   1. Asthma, moderate persistent, well-controlled  J45.40     2. Vaccine counseling  Z71.85      Asthma  Currently asthma is well controlled and stable with advair use 1-2 puffs daily and without singulair use   Plan -Continue advair scheduled but ok to take it 1 puff once daily -Use albuterol as needed  - CMA 07/10/2022  oto ensure 3 month supply with [facility] and 4 refills  Vaccine counseling   - recommend high dose flu shot 07/10/2022 - recommend RSV vaccine and covid mRNA booster later In fall 2023   Followup - 9 months or sooner; ACT score at follow-up -Any respiratory exacerbation symptoms please call us or come sooner    SIGNATURE    Dr. Brand Males, M.D., F.C.C.P,  Pulmonary and Critical Care Medicine Staff Physician, [place] Director - Interstitial Lung Disease  Program  Pulmonary [place] [facility]  [place], [place], [address]  Pager: [phone], If no answer or between  15:00h - 7:00h: call [phone] Telephone: [phone]  10:28 AM 07/10/2022

## 2022-07-10 NOTE — Addendum Note (Signed)
Addended by: Lorretta Harp on: 07/10/2022 11:04 AM   Modules accepted: Orders

## 2022-07-17 ENCOUNTER — Other Ambulatory Visit: Payer: Self-pay | Admitting: *Deleted

## 2022-07-17 MED ORDER — ALBUTEROL SULFATE HFA 108 (90 BASE) MCG/ACT IN AERS: 2.0000 | INHALATION_SPRAY | Freq: Four times a day (QID) | RESPIRATORY_TRACT | 3 refills | Status: DC | PRN

## 2023-01-28 ENCOUNTER — Telehealth: Payer: Self-pay | Admitting: Internal Medicine

## 2023-01-28 NOTE — Telephone Encounter (Signed)
Pt called the office stating that his insurance is no longer covering the Advair Diskus and wants to know if there is anything else that he could be prescribed in place of this.   Routing to prior auth team to see which inhalers are covered/preferred with pt's insurance.

## 2023-01-29 ENCOUNTER — Other Ambulatory Visit (HOSPITAL_COMMUNITY): Payer: Self-pay

## 2023-01-29 NOTE — Telephone Encounter (Signed)
Advair Diskus is covered for $65.00/30 days.  Brand Symbicort- $65.00/30 days Breo- $65.00/30 days

## 2023-01-30 NOTE — Telephone Encounter (Signed)
Attempted to call patient left voicemail for him to call office back to go over recommendation from MR

## 2023-01-30 NOTE — Telephone Encounter (Signed)
They are all the same . What does he want to do?

## 2023-02-23 ENCOUNTER — Telehealth: Payer: Self-pay | Admitting: Internal Medicine

## 2023-02-23 NOTE — Telephone Encounter (Signed)
   Pt calling about refill for Advair. It is no longer covered but Advair HFA and Renuity Elipta.  [facility]  Pls call PT to advise how we moved fwd for him @ [phone]

## 2023-02-27 NOTE — Telephone Encounter (Signed)
Called and spoke with pt who states Advair Diskus is no longer covered by his insurance. He states that Advair HFA and Arnuity are the covered inhalers.   Dr. Marchelle Gearing, please advise on which inhaler you want to switch pt to.  Preferred pharmacy is [facility]

## 2023-03-02 NOTE — Telephone Encounter (Signed)
Advair HFA 115 dose, 2 puff bid

## 2023-03-03 NOTE — Telephone Encounter (Signed)
Attempted to call pt but unable to reach. Left message for him to return call. °

## 2023-03-03 NOTE — Telephone Encounter (Signed)
Patient is returning phone call. Patient phone number is [phone]. May leave detailed message on voicemail.

## 2023-03-04 MED ORDER — FLUTICASONE-SALMETEROL 115-21 MCG/ACT IN AERO: 2.0000 | INHALATION_SPRAY | Freq: Two times a day (BID) | RESPIRATORY_TRACT | 3 refills | Status: DC

## 2023-03-04 NOTE — Telephone Encounter (Signed)
I called pt and left detailed msg letting him know that we have sent the advair hfa in place on advair diskus  I have sent rx to his preferred pharm  Nothing further needed

## 2023-05-20 ENCOUNTER — Ambulatory Visit (INDEPENDENT_AMBULATORY_CARE_PROVIDER_SITE_OTHER): Payer: No Typology Code available for payment source

## 2023-05-20 ENCOUNTER — Ambulatory Visit (INDEPENDENT_AMBULATORY_CARE_PROVIDER_SITE_OTHER): Payer: No Typology Code available for payment source | Admitting: Podiatry

## 2023-05-20 ENCOUNTER — Encounter: Payer: Self-pay | Admitting: Podiatry

## 2023-05-20 DIAGNOSIS — M79671 Pain in right foot: Secondary | ICD-10-CM

## 2023-05-20 NOTE — Progress Notes (Signed)
  Subjective:  Patient ID: CROSS JORGE, male    DOB: [birth year],   MRN: [id number]  No chief complaint on file.   67 y.o. male presents for concern for a bump on his right second toe that has been present for about a month and last couple weeks has worsened.  His PCP had advise him if the area grew or became painful to come in a be seen. Relates it has grown and has started to rub on the top in certain shoes and was concenred.  . Denies any other pedal complaints. Denies n/v/f/c.   Past Medical History:  Diagnosis Date   Allergy    8-10 months of a year    Arthritis    Asthma    COPD (chronic obstructive pulmonary disease) (HCC)    Coronary artery disease    GERD (gastroesophageal reflux disease)    Hypertension    Nephritis    as child    OSA on CPAP    Skin cancer    Sleep apnea    off cpap     Objective:  Physical Exam: Vascular: DP/PT pulses 2/4 bilateral. CFT <3 seconds. Normal hair growth on digits. No edema.  Skin. No lacerations or abrasions bilateral feet. Right second digit firm nodule noted at the PIPJ. No fluctuance noted. Minimal pain to palpation.  Musculoskeletal: MMT 5/5 bilateral lower extremities in DF, PF, Inversion and Eversion. Deceased ROM in DF of ankle joint.  Neurological: Sensation intact to light touch.   Assessment:   1. Right foot pain      Plan:  Patient was evaluated and treated and all questions answered. X-rays reviewed and discussed with patient. No osseous changes or acute fracture notes.  Discussed soft tissue mass and treatment options with the patient. Discussed getting an MRI vs continuing to monitor the area. Patient relates he would like to continue to monitor for now and will reach out if anything changes and we will get MRI.    Louann Sjogren, DPM

## 2023-07-08 ENCOUNTER — Other Ambulatory Visit: Payer: Self-pay | Admitting: Internal Medicine

## 2023-08-06 ENCOUNTER — Encounter: Payer: Self-pay | Admitting: Internal Medicine

## 2023-08-06 ENCOUNTER — Ambulatory Visit: Payer: No Typology Code available for payment source | Admitting: Internal Medicine

## 2023-08-06 VITALS — BP 138/92 | HR 71 | Ht 66.5 in | Wt 223.4 lb

## 2023-08-06 DIAGNOSIS — Z7185 Encounter for immunization safety counseling: Secondary | ICD-10-CM | POA: Diagnosis not present

## 2023-08-06 DIAGNOSIS — J4540 Moderate persistent asthma, uncomplicated: Secondary | ICD-10-CM

## 2023-08-06 DIAGNOSIS — F17290 Nicotine dependence, other tobacco product, uncomplicated: Secondary | ICD-10-CM

## 2023-08-06 NOTE — Patient Instructions (Addendum)
ICD-10-CM   1. Asthma, moderate persistent, well-controlled  J45.40     2. Vaccine counseling  Z71.85      Asthma  Currently asthma is well controlled and stable with advair HFA use 1-2 puffs daily and without singulair use   Plan -Continue advair scheduled but ok to reduce to 2 puff  once daily and see -Use albuterol as needed  - CMA to ensure 3 month supply with [facility]  Vaccine counseling   - recommend high dose flu shot 08/06/2023 - recommend RSV vaccine  in 2025 (wait 2 years)  Cigars  - very small amount of smoking  Plan  -does not meet threshold for lung cancer screening   Followup - 9 months or sooner; ACT score at follow-up -Any respiratory exacerbation symptoms please call us or come sooner

## 2023-08-06 NOTE — Progress Notes (Signed)
IOV 02/13/2015  Chief Complaint  Patient presents with   :Pulmonary Consult    Self Referral - Pt moved here from [place] and needs to establish with new pulmonary md -Asthma with some copd - Asthma worsened 3 years ago - Occas sob after eating or with increased stress - Denies cough or wheezing  - Pt also uses cpap    67 year-old morbidly obese male. Used to work for [employer] post and [place] today. 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 Wife also relocated here to work for [employer]. He reports a diagnosis of asthma made over 10 years ago. He's been on Advair which she takes only 1 puff at night along with Singulair for the last few years. His last visit with pulmonologist Dr. Tamera Punt in [place] was he says over a year ago. Last prednisone was over a year ago. Last permit function tests and chest x-ray was over a year ago. Limited outside records from Dr. Tamera Punt shows that he had a clear chest x-ray in December 2014. He was also subjected to methacholine challenge test per the results are known. Patient himself does not recollect any of these test results. Currently feels his asthma is extremely well controlled. His last albuterol use was several months ago. On the 5. asthma control test score was 22 showing good control  Specifically in the last 4 weeks he does not feel his asthma is preventing him from getting any of his work done. He admits to shortness of breath 3-6 times a week in the last 4 weeks. But he does not have any other asthma symptoms at this wheezing coughing chest tightness awaking up at night. Also in the last 4 weeks is not uses albuterol for rescue. Overall he feels his asthma is well controlled.  He does have chronic sleep apnea and uses CPAP but he does not have a sleep Dr.  Donn Pierini main purpose of this visit today is to establish care in [place] and get refills  His exhaled nitric oxide  FeNO is 24 ppb and shows good control of asthma   OV 12/25/2016  Chief Complaint  Patient presents with   Follow-up    Pt states overall his breahting feels slightly worse since last OV due to being off medication, pt last seen in 01/2015. Pt denies significant cough and CP/tightness.       67 year old male last seen in April 2016. Since then he says that he lost his job in [employer] and recommended and therefore his health insurance. He now works at [employer] on a part-time basis. He does not have health insurance. Therefore is not taking any inhaled corticosteroids. He only has Singulair the 90s while at I gave him. He says he is able to get it at 3 of cost but still makes it stretch for a whole year. Taking the Singulair only every few to several days. He says on the days he takes Singulair he feels well but has not been feeling poorly. Currently he is reporting lot more symptoms as documented below the asthma control questionnaire. This is one of the good days because he took Singulair last night. This past week is woken up at least a few times at night because of asthma. When he wakes up he feels he has moderate symptoms. He is not limited in his activities because of asthma. He has a moderate amount of shortness  of breath because of asthma and is wheezing a little of the time and using albuterol for rescue at least 3 or 4 times. He still has some difficulty understanding the difference between maintenance inhalers and as rescue inhalers. He is not sure why despite getting the Singulair free of cost is only taking it every few to several days. He's not sure how much of an inhaler would cost him .   no active chest pain or sputum or fever or chills   OV 06/04/2017  Chief Complaint  Patient presents with   Follow-up    Pt states his breathing is doing well. Pt denies cough, CP/tightness, and f/c/s.    67 year old male with clinical moderate persistent asthma. He returns for  follow-up. He still works at [employer]. He still does not have insurance. He is going to start a new job and he'll start having health insurance in a month. To 6 months. At this point in time he is not on Singulair. He only takes Dulera 2 puffs 2 times daily. He gets this med cost program for free. He says that asthma is well controlled. There is no albuterol use or chest pain or chest tightness. In fact is 5. asthma control questionnaire is 0. He is not waking up in the night with asthma symptoms. When he wakes up he has no problems. This no shortness of breath or wheezing or albuterol rescue use. He feels good.   OV 03/01/2018  Chief Complaint  Patient presents with   Follow-up    Pt states he has been doing good since last visit. States he has to clear his throat a lot. Has c/o SOB with exertion.    67 year old male with moderate persistent asthma on [facility]   Last visit August 2018.  Overall asthma has been stable.  Earlier this year he ended up with influenza this was 2 weeks after ventral hernia repair.  In any event he is stable now on [facility].  His asthma control questionnaire 0 out of 5.  Is not waking up in the middle of the night with asthma symptoms when he wakes up he does not have any symptoms.  His activities are not limited.  He experiences very little shortness of breath and is not wheezing at all.  He is only taking his Dulera at night.  He now has a job at [employer] as a Museum/gallery conservator.  His health insurance is fully covered.  He smokes an occasional cigar   OV 11/26/2018  Subjective:  Patient ID: Charles Mosley, male , DOB: 1955-11-20 , age 14 y.o. , MRN: [id number] , ADDRESS: [address] [place] [address]   11/26/2018 -   Chief Complaint  Patient presents with   Follow-up    Pt stated he has been doing good since last visit but states about 4 weeks ago he had a virus to where he was congested, coughing, and sneezing. Pt states he is now  feeling better and has been doing mucinex.     HPI Charles Mosley 67 y.o. -returns for routine follow-up.  He tells me that he now works at [employer].  Approximately 3 weeks ago a lot of sick contacts at work including if you got hospitalized for pneumonia.  He then subsequently picked up really bad "crud".  After this he had bad sinus infection with coughing and wheezing.  The cough is pretty intense.  Slowly little bit but but the symptoms have  significantly improved and largely resolved.  A few days ago he even had wheezing but in the last 3 days he has not had any problems including shortness of breath cough or wheezing.  His HCQ shows that he is not waking up in the middle of the night with asthma symptoms.  When he wakes up he does not have any symptoms.  He is not limited in his activities.  He is not short of breath.  He is breathing a little of the time but that was a few days ago.  Not using albuterol inhaler for rescue in the last few days.  Is up-to-date with his respiratory vaccines.  Exhaled nitric oxide today is 19ppb and normal    OV 02/20/2020  Subjective:  Patient ID: Charles Mosley, male , DOB: 1955/11/28 , age 61 y.o. , MRN: [id number] , ADDRESS: [address] [place] [address]   02/20/2020 -   Chief Complaint  Patient presents with   Follow-up    Pt states he has been doing good since last visit. States when he runs he has to use his rescue inhaler due to having some tightness in chest.     Moderate persistent asthma follow-up HPI Charles Mosley 67 y.o. -returns for asthma follow-up.  Last seen prior to the onset of the pandemic.  He now works remote for [employer].  He says he is doing really well.  He is taking his albuterol only as needed which is rare.  He takes his Advair 1 puff once daily only.  He said this is working really well for him.  He says 3 times a week he does different exercises including weights, treadmills, elliptical and also  bicycle.  He does this for 30-60 minutes each time.  He thinks he does it at a good pace.  He does not get any chest pain or asthma attack. However, in July 2020 -> he tried to run on the street and he did this for a few weeks but every time around the same distance he would get chest tightness that would be improved by albuterol.  He says that this is not cardiac in nature.  He strongly feels it is noncardiac in nature.  This because he has had multiple stress test including one in 2019 that was normal and is documented in the chart.  He is also on Singulair.  He is up-to-date with his respiratory vaccines but is not had his COVID-19 shot.  He says he was waiting to see real world experience before he could have this.  He stated an allergy to software version 1.1 before he decided to take it.  We discussed the fact he is not had any allergies to any injectables or vaccines other than having some side effect from the shingles vaccine.  We said that from the original study cohort more than 9 months of real world experience currently ongoing.  Currently millions of had vaccine especially the [employer] or Moderna.  We discussed the common side effects of inflammatory response syndrome.  After this he is more willing.  He is going to look into this.      ROS - per HPI    OV 12/19/2020  Subjective:  Patient ID: Charles Mosley, male , DOB: 1956/01/30 , age 52 y.o. , MRN: [id number] , ADDRESS: [address] [place] [address] PCP Melida Quitter, MD Patient Care Team: Melida Quitter, MD as PCP - General (Internal Medicine) Eden Emms,  Noralyn Pick, MD as PCP - Cardiology (Cardiology)  This Provider for this visit: Treatment Team:  Attending Provider: Kalman Shan, MD    12/19/2020 -   Chief Complaint  Patient presents with   Follow-up    PFT performed today. Pt states he has been doing well since last visit and denies any real complaints.   Moderate persistent asthma  follow-up  HPI Charles Mosley 67 y.o. -last seen April 2021.  Continues on Advair 1 puff daily and Singulair.  He says he does not take it he will feel it.  He does elliptical and weights for over 30 minutes on multiple times a day does not have any chest pain.  He says running is difficult for him but otherwise has no problem doing the other exercises.  He said only 2 shots of the mRNA vaccine last one being in June 2021.  He was hesitant to get the third shot.  Then his whole family got COVID-19 around 4 to 6 weeks ago.  He got similar symptoms of the sinus.  He did not test himself but high clinical probability for Covid.  Based on this he is not sure if he should get the third shot of the mRNA vaccine.  ACT control test is showing good control.  Pulmonary function test is normal.   Asthma Control Test ACT Total Score  12/19/2020 24       [facility] Lab Results  Component Value Date   NITRICOXIDE 19 11/26/2018      OV 09/12/2021  Subjective:  Patient ID: Charles Mosley, male , DOB: 1956/03/01 , age 12 y.o. , MRN: [id number] , ADDRESS: [address] [place] [address] PCP Melida Quitter, MD Patient Care Team: Melida Quitter, MD as PCP - General (Internal Medicine) Wendall Stade, MD as PCP - Cardiology (Cardiology)  This Provider for this visit: Treatment Team:  Attending Provider: Kalman Shan, MD    09/12/2021 -   Chief Complaint  Patient presents with   Follow-up    No new concerns     HPI Charles Mosley 67 y.o. -returns for moderate persistent asthma follow-up.  He is on Advair.  He taking Advair 1-2 puffs daily.  Some days 2 puffs some days 1 puff.  He is not taking Singulair.  He has had his flu shot.  His asthma is well controlled ACT score is 24.  His nitric oxide test is normal.  He continues to work for [employer].  He is happy with the job.  His last COVID-vaccine was in June 2021.  He believes strongly that in January 2022  he had COVID.  His IgG was only slightly positive at that time.  Which could have reflected the vaccine or natural infection.  He says he thinks he has COVID because his family was sick and he had similar symptoms.  His family had confirmed COVID.  He has not had bivValent mRNA booster.  He is reflecting on it.    OV 07/10/2022  Subjective:  Patient ID: Charles Mosley, male , DOB: 1956-08-12 , age 87 y.o. , MRN: [id number] , ADDRESS: [address] [place] [address] PCP Melida Quitter, MD Patient Care Team: Melida Quitter, MD as PCP - General (Internal Medicine) Wendall Stade, MD as PCP - Cardiology (Cardiology)  This Provider for this visit: Treatment Team:  Attending Provider: Kalman Shan, MD    07/10/2022 -   Chief Complaint  Patient  presents with   Follow-up    Pt states he has been doing okay since last visit.     HPI Charles Mosley 67 y.o. -presents for follow-up.  He is compliant with his Advair doing really well.  He is going to run out of his Advair and albuterol and he wants a refill.  He continues his daily exercise.  No respiratory infections no health issues in the interim.  We talked about various vaccines.  We discussed the virus RSV in more detail.  He did not have the flu shot today.        OV 08/06/2023  Subjective:  Patient ID: Charles Mosley, male , DOB: 10/25/1956 , age 20 y.o. , MRN: [id number] , ADDRESS: [address] [place] [address] PCP Melida Quitter, MD Patient Care Team: Melida Quitter, MD as PCP - General (Internal Medicine) Wendall Stade, MD as PCP - Cardiology (Cardiology)  This Provider for this visit: Treatment Team:  Attending Provider: Kalman Shan, MD    08/06/2023 -   Chief Complaint  Patient presents with   Follow-up    No current concerns. Still using Advair HFA     HPI Charles Mosley 67 y.o. -here for asthma follow-up.  No new interim complaints. Interim Health status:  No new complaints No new medical problems. No new surgeries. No ER visits. No Urgent care visits. No changes to medications.  No nocturnal awakenings.  He says sometimes in the morning he forgets his Advair HFA.  Currently on Advair HFA.  Nighttime he will take it.  He says he is mostly compliant.  Occasionally when he forgets he will notice some nighttime symptoms.  So he does not think he can do a as needed program with an inhaler such as Airsupra.  He had his RSV vaccine last year.  He wants to have a flu shot today.  He continues to work for [employer].  He had his phone calls.  He smokes cigars.  But he says he only smokes 10-20 in the whole year.  We discussed that this does not meet threshold for low-dose CT scan lung cancer screening.  He does not have chronic dyspnea on exertion.      Asthma Control Panel 02/13/2015  12/25/2016  06/04/2017  03/01/2018  11/26/2018  02/20/2020 09/12/2021   Current Med Regimen advair at night + singulair at night singulair every few days and alb prn Dulera. No singulare dulera and allegra dulera at night (URI 3 weeks earlier()  Advair and Singulair   ACQ 5 point- 1 week (wtd avg score) x 2 0 0.2 0.4  0.2 24 (ACT)  FeNO ppB 24 ppb on 02/13/2015  61ppb x x   17 ppbc  FeV1  x x x x     Planned intervention  for visit cotninue advair/singulair - no change in baseline regime  dulera continue dulera at night to continue        PFT     Latest Ref Rng & Units 12/19/2020    8:42 AM  ILD indicators  FVC-Pre L 3.59   FVC-Predicted Pre % 92   DLCO uncorrected ml/min/mmHg 37.36   DLCO UNC %Pred % 160   DLCO Corrected ml/min/mmHg 37.36   DLCO COR %Pred % 160       LAB RESULTS last 96 hours No results found.  LAB RESULTS last 90 days No results found for this or any previous visit (from the past 2160 hour(s)).  has a past medical history of Allergy, Arthritis, Asthma, COPD (chronic obstructive pulmonary disease) (HCC), Coronary artery  disease, GERD (gastroesophageal reflux disease), Hypertension, Nephritis, OSA on CPAP, Skin cancer, and Sleep apnea.   reports that he has been smoking cigars. His smokeless tobacco use includes chew.  Past Surgical History:  Procedure Laterality Date   COLONOSCOPY     10-15 yrs ago- normal -in [place] per pt    CORONARY ANGIOPLASTY  2012   FINGER SURGERY     dropped 100 lb WEight    INGUINAL HERNIA REPAIR Left 11/13/2017   Procedure: OPEN LEFT INGUINAL HERNIA REPAIR WITH MESH;  Surgeon: Berna Bue, MD;  Location: [facility] OR;  Service: General;  Laterality: Left;  GENERAL AND TAP BLOCK   INSERTION OF MESH Left 11/13/2017   Procedure: INSERTION OF MESH LEFT INGUINAL HERNIA;  Surgeon: Berna Bue, MD;  Location: [facility] OR;  Service: General;  Laterality: Left;  GENERAL AND TAP BLOCK   SKIN BIOPSY     WISDOM TOOTH EXTRACTION      No Known Allergies  Immunization History  Administered Date(s) Administered   Fluad Quad(high Dose 65+) 07/10/2022   Influenza Split 07/27/2014   Influenza, High Dose Seasonal PF 07/15/2019, 07/27/2020   Influenza, Quadrivalent, Recombinant, Inj, Pf 08/13/2018   Influenza,inj,Quad PF,6+ Mos 07/27/2016, 07/17/2017   Moderna Sars-Covid-2 Vaccination 03/13/2020, 04/10/2020   Pneumococcal Conjugate-13 10/27/2012   Pneumococcal Polysaccharide-23 10/27/2012    Family History  Problem Relation Age of Onset   Cancer Mother        CLL, Lymphoma   Emphysema Father    Colon cancer Neg Hx    Colon polyps Neg Hx    Esophageal cancer Neg Hx    Rectal cancer Neg Hx    Stomach cancer Neg Hx      Current Outpatient Medications:    albuterol (VENTOLIN HFA) 108 (90 Base) MCG/ACT inhaler, USE 2 INHALATIONS BY MOUTH EVERY 6 HOURS AS NEEDED FOR WHEEZING  OR SHORTNESS OF BREATH, Disp: 18 g, Rfl: 0   aspirin EC 81 MG tablet, Take 81 mg by mouth daily., Disp: , Rfl:    fexofenadine (ALLEGRA) 180 MG tablet, Take 180 mg by mouth daily as needed for allergies or rhinitis.,  Disp: , Rfl:    fluticasone-salmeterol (ADVAIR HFA) 115-21 MCG/ACT inhaler, Inhale 2 puffs into the lungs 2 (two) times daily., Disp: 36 g, Rfl: 3   pseudoephedrine (SUDAFED) 30 MG tablet, Take 30 mg by mouth every 4 (four) hours as needed for congestion., Disp: , Rfl:    raNITIdine HCl (ZANTAC PO), Take 1 tablet by mouth as needed., Disp: , Rfl:    valsartan-hydrochlorothiazide (DIOVAN-HCT) 160-25 MG tablet, Take 1 tablet by mouth daily., Disp: , Rfl:    fluticasone-salmeterol (ADVAIR DISKUS) 250-50 MCG/ACT AEPB, Inhale 1 puff into the lungs in the morning and at bedtime. (Patient not taking: Reported on 08/06/2023), Disp: 180 each, Rfl: 3      Objective:   Vitals:   08/06/23 1506  BP: (!) 138/92  Pulse: 71  SpO2: 97%  Weight: 223 lb 6.4 oz (101.3 kg)  Height: 5' 6.5" (1.689 m)    Estimated body mass index is 35.52 kg/m as calculated from the following:   Height as of this encounter: 5' 6.5" (1.689 m).   Weight as of this encounter: 223 lb 6.4 oz (101.3 kg).  @WEIGHTCHANGE @  [employer]   08/06/23 1506  Weight: 223 lb 6.4 oz (101.3 kg)     Physical  Exam   General: No distress. Looks well O2 at rest: no Cane present: no Sitting in wheel chair: no Frail: no Obese: no Neuro: Alert and Oriented x 3. GCS 15. Speech normal Psych: Pleasant Resp:  Barrel Chest - no.  Wheeze - no, Crackles - no, No overt respiratory distress CVS: Normal heart sounds. Murmurs - no Ext: Stigmata of Connective Tissue Disease - no HEENT: Normal upper airway. PEERL +. No post nasal drip        Assessment:       ICD-10-CM   1. Asthma, moderate persistent, well-controlled  J45.40     2. Vaccine counseling  Z71.85     3. Smokes cigars  F17.290          Plan:     Patient Instructions     ICD-10-CM   1. Asthma, moderate persistent, well-controlled  J45.40     2. Vaccine counseling  Z71.85      Asthma  Currently asthma is well controlled and stable with advair HFA use 1-2  puffs daily and without singulair use   Plan -Continue advair scheduled but ok to reduce to 2 puff  once daily and see -Use albuterol as needed  - CMA to ensure 3 month supply with [facility]  Vaccine counseling   - recommend high dose flu shot 08/06/2023 - recommend RSV vaccine  in 2025 (wait 2 years)  Cigars  - very small amount of smoking  Plan  -does not meet threshold for lung cancer screening   Followup - 9 months or sooner; ACT score at follow-up -Any respiratory exacerbation symptoms please call us or come sooner   FOLLOWUP Return in about 9 months (around 05/05/2024) for 15 min visit, with Dr Marchelle Gearing, Face to Face Visit.    SIGNATURE    Dr. Kalman Shan, M.D., F.C.C.P,  Pulmonary and Critical Care Medicine Staff Physician, [facility] Director - Interstitial Lung Disease  Program  Pulmonary Fibrosis [facility] Network at [facility] [place], [place], [address]  Pager: [phone], If no answer or between  15:00h - 7:00h: call [phone] Telephone: [phone]  4:05 PM 08/06/2023

## 2023-08-06 NOTE — Addendum Note (Signed)
Addended by: Glynda Jaeger on: 08/06/2023 04:22 PM   Modules accepted: Orders

## 2023-08-11 ENCOUNTER — Other Ambulatory Visit: Payer: Self-pay | Admitting: Internal Medicine

## 2023-08-24 ENCOUNTER — Telehealth: Payer: Self-pay | Admitting: Internal Medicine

## 2023-08-24 MED ORDER — FLUTICASONE-SALMETEROL 115-21 MCG/ACT IN AERO: 2.0000 | INHALATION_SPRAY | Freq: Two times a day (BID) | RESPIRATORY_TRACT | 11 refills | Status: DC

## 2023-08-24 MED ORDER — FLUTICASONE-SALMETEROL 115-21 MCG/ACT IN AERO: 2.0000 | INHALATION_SPRAY | Freq: Two times a day (BID) | RESPIRATORY_TRACT | 0 refills | Status: DC

## 2023-08-24 NOTE — Telephone Encounter (Signed)
Patient states needs Advair sent to [facility] in [facility]. Pharmacy phone number is [phone]. Patient phone number is [phone].

## 2023-08-24 NOTE — Telephone Encounter (Signed)
Rx sent to pharmacy   

## 2023-08-24 NOTE — Telephone Encounter (Signed)
Pt went on Vacation and forgot his Advair inhaler    [facility] [address] [place], [place] [address]

## 2023-08-24 NOTE — Telephone Encounter (Signed)
Called the pt and made him aware I have sent rx to preferred pharm  Nothing further needed

## 2023-09-21 ENCOUNTER — Other Ambulatory Visit: Payer: Self-pay | Admitting: Internal Medicine

## 2023-09-21 DIAGNOSIS — R0989 Other specified symptoms and signs involving the circulatory and respiratory systems: Secondary | ICD-10-CM

## 2023-10-01 ENCOUNTER — Ambulatory Visit
Admission: RE | Admit: 2023-10-01 | Discharge: 2023-10-01 | Disposition: A | Discharge status: Home / Self Care | Payer: No Typology Code available for payment source | Source: Ambulatory Visit | Attending: Internal Medicine | Admitting: Internal Medicine

## 2023-10-01 DIAGNOSIS — R0989 Other specified symptoms and signs involving the circulatory and respiratory systems: Secondary | ICD-10-CM

## 2024-03-02 ENCOUNTER — Ambulatory Visit (INDEPENDENT_AMBULATORY_CARE_PROVIDER_SITE_OTHER): Admitting: Podiatry

## 2024-03-02 DIAGNOSIS — M7989 Other specified soft tissue disorders: Secondary | ICD-10-CM | POA: Diagnosis not present

## 2024-03-02 NOTE — Progress Notes (Signed)
  Subjective:  Patient ID: Charles Mosley, male    DOB: 1956/08/04,   MRN: [id number]  No chief complaint on file.   68 y.o. male presents for follow-up of mass on his right second toe that he relates he noticed has gotten bigger recently and wanted to check it out.   . Denies any other pedal complaints. Denies n/v/f/c.   Past Medical History:  Diagnosis Date   Allergy    8-10 months of a year    Arthritis    Asthma    COPD (chronic obstructive pulmonary disease) (HCC)    Coronary artery disease    GERD (gastroesophageal reflux disease)    Hypertension    Nephritis    as child    OSA on CPAP    Skin cancer    Sleep apnea    off cpap     Objective:  Physical Exam: Vascular: DP/PT pulses 2/4 bilateral. CFT <3 seconds. Normal hair growth on digits. No edema.  Skin. No lacerations or abrasions bilateral feet. Right second digit firm nodule noted at the PIPJ. No fluctuance noted. Minimal pain to palpation.  Musculoskeletal: MMT 5/5 bilateral lower extremities in DF, PF, Inversion and Eversion. Deceased ROM in DF of ankle joint.  Neurological: Sensation intact to light touch.   Assessment:   1. Soft tissue mass       Plan:  Patient was evaluated and treated and all questions answered. X-rays reviewed and discussed with patient. No osseous changes or acute fracture notes.  Discussed soft tissue mass and treatment options with the patient. Discussed getting an MRI vs continuing to monitor the area. Patient amenable to MRI at this time.  MRI ordered  Will follow-up after MRI results in.   Jennefer Moats, DPM

## 2024-03-03 ENCOUNTER — Other Ambulatory Visit: Payer: Self-pay | Admitting: Podiatry

## 2024-03-03 ENCOUNTER — Ambulatory Visit
Admission: RE | Admit: 2024-03-03 | Discharge: 2024-03-03 | Disposition: A | Discharge status: Home / Self Care | Source: Ambulatory Visit | Attending: Podiatry | Admitting: Podiatry

## 2024-03-03 DIAGNOSIS — M7989 Other specified soft tissue disorders: Secondary | ICD-10-CM

## 2024-03-08 ENCOUNTER — Telehealth: Payer: Self-pay | Admitting: Podiatry

## 2024-03-08 NOTE — Telephone Encounter (Signed)
 He has not gotten a call about the referral from 03/02/2024 visited  MRI/CAT Scan/Imaging   To schedule an appointment.

## 2024-03-15 ENCOUNTER — Telehealth: Payer: Self-pay | Admitting: Podiatry

## 2024-03-15 NOTE — Telephone Encounter (Signed)
 [facility] states they do not have the referral. Can you resend?

## 2024-03-15 NOTE — Addendum Note (Signed)
 Addended by: Marenda Accardi on: 03/15/2024 05:01 PM   Modules accepted: Orders

## 2024-03-16 ENCOUNTER — Ambulatory Visit
Admission: RE | Admit: 2024-03-16 | Discharge: 2024-03-16 | Disposition: A | Discharge status: Home / Self Care | Source: Ambulatory Visit | Attending: Podiatry | Admitting: Podiatry

## 2024-03-16 DIAGNOSIS — M7989 Other specified soft tissue disorders: Secondary | ICD-10-CM

## 2024-03-16 MED ORDER — GADOPICLENOL 0.5 MMOL/ML IV SOLN
10.0000 mL | Freq: Once | INTRAVENOUS | Status: AC | PRN
  Administered 2024-03-16: 10 mL via INTRAVENOUS

## 2024-03-16 NOTE — Telephone Encounter (Signed)
 Called  Pt to give message and he had just gotten off the phone with them to schedule. TY

## 2024-03-31 ENCOUNTER — Telehealth: Payer: Self-pay | Admitting: Podiatry

## 2024-03-31 NOTE — Telephone Encounter (Signed)
 Pt calling to see if MRI results  from 5/21 w/ contrast are in. When told taking 3-4 weeks, he stated Dr got the last ones back in 2 days.

## 2024-04-05 ENCOUNTER — Telehealth: Payer: Self-pay | Admitting: Podiatry

## 2024-04-05 NOTE — Telephone Encounter (Signed)
 Patient doesn't currently have health insurance. He should have insurance in a month( new job). Can he wait until then to come in for appointment to discuss MRI?

## 2024-04-08 ENCOUNTER — Telehealth: Payer: Self-pay | Admitting: Podiatry

## 2024-04-08 NOTE — Telephone Encounter (Signed)
 Called to schedule his next appointment and let him know Dr. Alvah Auerbach said it is okay to wait about a month for appointment.

## 2024-05-03 ENCOUNTER — Telehealth: Payer: Self-pay | Admitting: Podiatry

## 2024-05-03 NOTE — Telephone Encounter (Signed)
 Left message for patient to call us  and schedule appointment and go over what insurance patient has per voicemail left on machine

## 2024-05-23 ENCOUNTER — Ambulatory Visit (INDEPENDENT_AMBULATORY_CARE_PROVIDER_SITE_OTHER): Admitting: Podiatry

## 2024-05-23 ENCOUNTER — Encounter: Payer: Self-pay | Admitting: Podiatry

## 2024-05-23 DIAGNOSIS — M7989 Other specified soft tissue disorders: Secondary | ICD-10-CM | POA: Diagnosis not present

## 2024-05-23 NOTE — Progress Notes (Signed)
  Subjective:  Patient ID: Charles Mosley, male    DOB: 12-Jan-1956,   MRN: [id number]  Chief Complaint  Patient presents with   Foot Pain    She had me to get a contrast MRI.  Other than my toe looking horrible, it's doing fine.    68 y.o. male presents for follow-up of mass on his right second toe that he relates he noticed has gotten bigger recently but has not been painful despite working on his feet a lot.    . Denies any other pedal complaints. Denies n/v/f/c.   Past Medical History:  Diagnosis Date   Allergy    8-10 months of a year    Arthritis    Asthma    COPD (chronic obstructive pulmonary disease) (HCC)    Coronary artery disease    GERD (gastroesophageal reflux disease)    Hypertension    Nephritis    as child    OSA on CPAP    Skin cancer    Sleep apnea    off cpap     Objective:  Physical Exam: Vascular: DP/PT pulses 2/4 bilateral. CFT <3 seconds. Normal hair growth on digits. No edema.  Skin. No lacerations or abrasions bilateral feet. Right second digit firm nodule noted at the PIPJ. No fluctuance noted. Minimal pain to palpation.  Musculoskeletal: MMT 5/5 bilateral lower extremities in DF, PF, Inversion and Eversion. Deceased ROM in DF of ankle joint.  Neurological: Sensation intact to light touch.   IMPRESSION: 1. The lesion of concern dorsally along the second toe does not discernibly enhance and is mildly hypointense on fat saturated T1 weighted sequences. Possibilities include a small fibroma, rheumatoid nodule, or small chronic ill-defined hematoma. If the lesion enlarges or is associated with progressive symptoms, biopsy or reimaging may be indicated. 2. Arthropathy at the second digit proximal interphalangeal joint with mild enhancing synovitis but without an overt joint effusion. Likely reactive subtle enhancement medially along the head of the proximal phalanx. 3. Low level dorsal forefoot subcutaneous edema without  overt enhancement.   Assessment:   No diagnosis found.     Plan:  Patient was evaluated and treated and all questions answered. X-rays reviewed and discussed with patient. No osseous changes or acute fracture notes.  Discussed soft tissue mass and treatment options with the patient. MRI reviewed and shows non enhancing mass likely firboma vs rhuematoid nodule vs hematoma.  Discussed these are benign lesions. Discussed continuing to monitor and if they ever do become painful or bothersome we can remove.  For now continue to monitor.  Return as needed  Asberry Failing, DPM

## 2024-08-16 ENCOUNTER — Encounter: Payer: Self-pay | Admitting: Internal Medicine

## 2024-08-16 ENCOUNTER — Ambulatory Visit: Admitting: Internal Medicine

## 2024-08-16 VITALS — BP 122/72 | HR 90 | Temp 97.8°F | Ht 66.0 in | Wt 216.0 lb

## 2024-08-16 DIAGNOSIS — Z23 Encounter for immunization: Secondary | ICD-10-CM | POA: Diagnosis not present

## 2024-08-16 DIAGNOSIS — J4540 Moderate persistent asthma, uncomplicated: Secondary | ICD-10-CM

## 2024-08-16 DIAGNOSIS — Z7185 Encounter for immunization safety counseling: Secondary | ICD-10-CM

## 2024-08-16 MED ORDER — FLUTICASONE-SALMETEROL 115-21 MCG/ACT IN AERO: 2.0000 | INHALATION_SPRAY | Freq: Two times a day (BID) | RESPIRATORY_TRACT | 11 refills | Status: AC

## 2024-08-16 MED ORDER — ALBUTEROL SULFATE HFA 108 (90 BASE) MCG/ACT IN AERS: 2.0000 | INHALATION_SPRAY | RESPIRATORY_TRACT | 6 refills | Status: AC | PRN

## 2024-08-16 NOTE — Progress Notes (Signed)
 IOV 02/13/2015  Chief Complaint  Patient presents with   :Pulmonary Consult    Self Referral - Pt moved here from [place]  and needs to establish with new pulmonary md -Asthma with some copd - Asthma worsened 3 years ago - Occas sob after eating or with increased stress - Denies cough or wheezing  - Pt also uses cpap    68 year-old morbidly obese male. Used to work for [employer] and [employer]  today. 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 Wife also relocated here to work for [employer]. He reports a diagnosis of asthma made over 10 years ago. He's been on Advair which she takes only 1 puff at night along with Singulair  for the last few years. His last visit with pulmonologist Dr. Mayo in [place]  was he says over a year ago. Last prednisone  was over a year ago. Last permit function tests and chest x-ray was over a year ago. Limited outside records from Dr. Mayo shows that he had a clear chest x-ray in December 2014. He was also subjected to methacholine challenge test per the results are known. Patient himself does not recollect any of these test results. Currently feels his asthma is extremely well controlled. His last albuterol  use was several months ago. On the 5. asthma control test score was 22 showing good control  Specifically in the last 4 weeks he does not feel his asthma is preventing him from getting any of his work done. He admits to shortness of breath 3-6 times a week in the last 4 weeks. But he does not have any other asthma symptoms at this wheezing coughing chest tightness awaking up at night. Also in the last 4 weeks is not uses albuterol  for rescue. Overall he feels his asthma is well controlled.  He does have chronic sleep apnea and uses CPAP but he does not have a sleep Dr.  Doris main purpose of this visit today is to establish care in [place] and get refills  His exhaled nitric oxide   FeNO is 24 ppb and shows good control of asthma   OV 12/25/2016  Chief Complaint  Patient presents with   Follow-up    Pt states overall his breahting feels slightly worse since last OV due to being off medication, pt last seen in 01/2015. Pt denies significant cough and CP/tightness.       68 year old male last seen in April 2016. Since then he says that he lost his job in [employer] and recommended and therefore his health insurance. He now works at [employer] on a part-time basis. He does not have health insurance. Therefore is not taking any inhaled corticosteroids. He only has Singulair  the 90s while at I gave him. He says he is able to get it at 3 of cost but still makes it stretch for a whole year. Taking the Singulair  only every few to several days. He says on the days he takes Singulair  he feels well but has not been feeling poorly. Currently he is reporting lot more symptoms as documented below the asthma control questionnaire. This is one of the good days because he took Singulair  last night. This past week is woken up at least a few times at night because of asthma. When he wakes up he feels he has moderate symptoms. He is not limited in his activities because of asthma. He has a moderate amount of shortness of breath  because of asthma and is wheezing a little of the time and using albuterol  for rescue at least 3 or 4 times. He still has some difficulty understanding the difference between maintenance inhalers and as rescue inhalers. He is not sure why despite getting the Singulair  free of cost is only taking it every few to several days. He's not sure how much of an inhaler would cost him .   no active chest pain or sputum or fever or chills   OV 06/04/2017  Chief Complaint  Patient presents with   Follow-up    Pt states his breathing is doing well. Pt denies cough, CP/tightness, and f/c/s.    68 year old male with clinical moderate persistent asthma. He returns for  follow-up. He still works at [employer]. He still does not have insurance. He is going to start a new job and he'll start having health insurance in a month. To 6 months. At this point in time he is not on Singulair . He only takes Dulera 2 puffs 2 times daily. He gets this med cost program for free. He says that asthma is well controlled. There is no albuterol  use or chest pain or chest tightness. In fact is 5. asthma control questionnaire is 0. He is not waking up in the night with asthma symptoms. When he wakes up he has no problems. This no shortness of breath or wheezing or albuterol  rescue use. He feels good.   OV 03/01/2018  Chief Complaint  Patient presents with   Follow-up    Pt states he has been doing good since last visit. States he has to clear his throat a lot. Has c/o SOB with exertion.    68 year old male with moderate persistent asthma on Dulera   Last visit August 2018.  Overall asthma has been stable.  Earlier this year he ended up with influenza this was 2 weeks after ventral hernia repair.  In any event he is stable now on Dulera.  His asthma control questionnaire 0 out of 5.  Is not waking up in the middle of the night with asthma symptoms when he wakes up he does not have any symptoms.  His activities are not limited.  He experiences very little shortness of breath and is not wheezing at all.  He is only taking his Dulera at night.  He now has a job at [employer] as a Museum/gallery conservator.  His health insurance is fully covered.  He smokes an occasional cigar   OV 11/26/2018  Subjective:  Patient ID: Charles Mosley, male , DOB: May 03, 1956 , age 68 y.o. , MRN: [id number] , ADDRESS: [address] [place] [address]   11/26/2018 -   Chief Complaint  Patient presents with   Follow-up    Pt stated he has been doing good since last visit but states about 4 weeks ago he had a virus to where he was congested, coughing, and sneezing. Pt states he is now  feeling better and has been doing mucinex.     HPI Charles Mosley 68 y.o. -returns for routine follow-up.  He tells me that he now works at [employer].  Approximately 3 weeks ago a lot of sick contacts at work including if you got hospitalized for pneumonia.  He then subsequently picked up really bad crud.  After this he had bad sinus infection with coughing and wheezing.  The cough is pretty intense.  Slowly little bit but but the symptoms have significantly improved  and largely resolved.  A few days ago he even had wheezing but in the last 3 days he has not had any problems including shortness of breath cough or wheezing.  His HCQ shows that he is not waking up in the middle of the night with asthma symptoms.  When he wakes up he does not have any symptoms.  He is not limited in his activities.  He is not short of breath.  He is breathing a little of the time but that was a few days ago.  Not using albuterol  inhaler for rescue in the last few days.  Is up-to-date with his respiratory vaccines.  Exhaled nitric oxide  today is 19ppb and normal    OV 02/20/2020  Subjective:  Patient ID: Charles Mosley, male , DOB: 12-29-1955 , age 45 y.o. , MRN: [id number] , ADDRESS: [address] [place] [address]   02/20/2020 -   Chief Complaint  Patient presents with   Follow-up    Pt states he has been doing good since last visit. States when he runs he has to use his rescue inhaler due to having some tightness in chest.     Moderate persistent asthma follow-up HPI Charles Mosley 68 y.o. -returns for asthma follow-up.  Last seen prior to the onset of the pandemic.  He now works remote for [employer].  He says he is doing really well.  He is taking his albuterol  only as needed which is rare.  He takes his Advair 1 puff once daily only.  He said this is working really well for him.  He says 3 times a week he does different exercises including weights, treadmills, elliptical and also  bicycle.  He does this for 30-60 minutes each time.  He thinks he does it at a good pace.  He does not get any chest pain or asthma attack. However, in July 2020 -> he tried to run on the street and he did this for a few weeks but every time around the same distance he would get chest tightness that would be improved by albuterol .  He says that this is not cardiac in nature.  He strongly feels it is noncardiac in nature.  This because he has had multiple stress test including one in 2019 that was normal and is documented in the chart.  He is also on Singulair .  He is up-to-date with his respiratory vaccines but is not had his COVID-19 shot.  He says he was waiting to see real world experience before he could have this.  He stated an allergy to software version 1.1 before he decided to take it.  We discussed the fact he is not had any allergies to any injectables or vaccines other than having some side effect from the shingles vaccine.  We said that from the original study cohort more than 9 months of real world experience currently ongoing.  Currently millions of had vaccine especially the Pfizer or Moderna.  We discussed the common side effects of inflammatory response syndrome.  After this he is more willing.  He is going to look into this.      ROS - per HPI    OV 12/19/2020  Subjective:  Patient ID: Charles Mosley, male , DOB: [birth year] , age 57 y.o. , MRN: [id number] , ADDRESS: [address] [place] [address] PCP Stephane Leita DEL, MD Patient Care Team: Stephane Leita DEL, MD as PCP - General (Internal Medicine) Delford Maude BROCKS,  MD as PCP - Cardiology (Cardiology)  This Provider for this visit: Treatment Team:  Attending Provider: Geronimo Amel, MD    12/19/2020 -   Chief Complaint  Patient presents with   Follow-up    PFT performed today. Pt states he has been doing well since last visit and denies any real complaints.   Moderate persistent asthma  follow-up  HPI Charles Mosley 68 y.o. -last seen April 2021.  Continues on Advair 1 puff daily and Singulair .  He says he does not take it he will feel it.  He does elliptical and weights for over 30 minutes on multiple times a day does not have any chest pain.  He says running is difficult for him but otherwise has no problem doing the other exercises.  He said only 2 shots of the mRNA vaccine last one being in June 2021.  He was hesitant to get the third shot.  Then his whole family got COVID-19 around 4 to 6 weeks ago.  He got similar symptoms of the sinus.  He did not test himself but high clinical probability for Covid.  Based on this he is not sure if he should get the third shot of the mRNA vaccine.  ACT control test is showing good control.  Pulmonary function test is normal.   Asthma Control Test ACT Total Score  12/19/2020 24       [facility] Lab Results  Component Value Date   NITRICOXIDE 19 11/26/2018      OV 09/12/2021  Subjective:  Patient ID: Charles Mosley, male , DOB: [birth year] , age 17 y.o. , MRN: [id number] , AD[address]ESS: [address] [place] [address] PCP Stephane Leita DEL, MD Patient Care Team: Stephane Leita DEL, MD as PCP - General (Internal Medicine) Delford Maude BROCKS, MD as PCP - Cardiology (Cardiology)  This Provider for this visit: Treatment Team:  Attending Provider: Geronimo Amel, MD    09/12/2021 -   Chief Complaint  Patient presents with   Follow-up    No new concerns     HPI Charles Mosley 68 y.o. -returns for moderate persistent asthma follow-up.  He is on Advair.  He taking Advair 1-2 puffs daily.  Some days 2 puffs some days 1 puff.  He is not taking Singulair .  He has had his flu shot.  His asthma is well controlled ACT score is 24.  His nitric oxide  test is normal.  He continues to work for [employer].  He is happy with the job.  His last COVID-vaccine was in June 2021.  He believes strongly that in January 2022  he had COVID.  His IgG was only slightly positive at that time.  Which could have reflected the vaccine or natural infection.  He says he thinks he has COVID because his family was sick and he had similar symptoms.  His family had confirmed COVID.  He has not had bivValent mRNA booster.  He is reflecting on it.    OV 07/10/2022  Subjective:  Patient ID: Charles Mosley, male , DOB: 02-06-1956 , age 45 y.o. , MRN: [id number] , AD[address]ESS: [address] [place] [address] PCP Stephane Leita DEL, MD Patient Care Team: Stephane Leita DEL, MD as PCP - General (Internal Medicine) Delford Maude BROCKS, MD as PCP - Cardiology (Cardiology)  This Provider for this visit: Treatment Team:  Attending Provider: Geronimo Amel, MD    07/10/2022 -   Chief Complaint  Patient presents with  Follow-up    Pt states he has been doing okay since last visit.     HPI Charles Mosley 68 y.o. -presents for follow-up.  He is compliant with his Advair doing really well.  He is going to run out of his Advair and albuterol  and he wants a refill.  He continues his daily exercise.  No respiratory infections no health issues in the interim.  We talked about various vaccines.  We discussed the virus RSV in more detail.  He did not have the flu shot today.        OV 08/06/2023  Subjective:  Patient ID: Charles Mosley, male , DOB: 06-08-1956 , age 3 y.o. , MRN: [id number] , AD[address]ESS: [address] [place] [address] PCP Stephane Leita DEL, MD Patient Care Team: Stephane Leita DEL, MD as PCP - General (Internal Medicine) Delford Maude BROCKS, MD as PCP - Cardiology (Cardiology)  This Provider for this visit: Treatment Team:  Attending Provider: Geronimo Amel, MD    08/06/2023 -   Chief Complaint  Patient presents with   Follow-up    No current concerns. Still using Advair HFA     HPI Charles Mosley 68 y.o. -here for asthma follow-up.  No new interim complaints. Interim Health status:  No new complaints No new medical problems. No new surgeries. No ER visits. No Urgent care visits. No changes to medications.  No nocturnal awakenings.  He says sometimes in the morning he forgets his Advair HFA.  Currently on Advair HFA.  Nighttime he will take it.  He says he is mostly compliant.  Occasionally when he forgets he will notice some nighttime symptoms.  So he does not think he can do a as needed program with an inhaler such as Airsupra.  He had his RSV vaccine last year.  He wants to have a flu shot today.  He continues to work for [employer].  He had his phone calls.  He smokes cigars.  But he says he only smokes 10-20 in the whole year.  We discussed that this does not meet threshold for low-dose CT scan lung cancer screening.  He does not have chronic dyspnea on exertion.      OV 08/16/2024  Subjective:  Patient ID: Charles Mosley, male , DOB: 07/21/1956 , age 44 y.o. , MRN: [id number] , ADDRESS: [address] [place] [address] PCP Stephane Leita DEL, MD Patient Care Team: Stephane Leita DEL, MD as PCP - General (Internal Medicine) Delford Maude BROCKS, MD as PCP - Cardiology (Cardiology)  This Provider for this visit: Treatment Team:  Attending Provider: Geronimo Amel, MD    08/16/2024 -   Chief Complaint  Patient presents with   Asthma    Pt states since LOV breathing has been fine       HPI Charles Mosley 67 y.o. -Followup asthma in occassional cigar smoker  He is now working in  [employer]. Taking advair twice daily. Anyting less than thhis he got dyspneic. Interim Health status: No new complaints No new medical problems. No new surgeries. No ER visits. No Urgent care visits. No changes to medications . He wil have Prevnar -20 08/16/2024. For unclear reasons did not want flu shot 08/16/2024      Asthma Control Panel 02/13/2015  12/25/2016  06/04/2017  03/01/2018  11/26/2018  02/20/2020 09/12/2021   Current Med Regimen advair at night +  singulair  at night singulair  every few days and alb prn Dulera. No  singulare dulera and allegra dulera at night (URI 3 weeks earlier()  Advair and Singulair    ACQ 5 point- 1 week (wtd avg score) x 2 0 0.2 0.4  0.2 24 (ACT)  FeNO ppB 24 ppb on 02/13/2015  61ppb x x   17 ppbc  FeV1  x x x x     Planned intervention  for visit cotninue advair/singulair  - no change in baseline regime  dulera continue dulera at night to continue         PFT     Latest Ref Rng & Units 12/19/2020    8:42 AM  PFT Results  FVC-Pre L 3.59   FVC-Predicted Pre % 92   Pre FEV1/FVC % % 73   FEV1-Pre L 2.61   FEV1-Predicted Pre % 90   DLCO uncorrected ml/min/mmHg 37.36   DLCO UNC% % 160   DLCO corrected ml/min/mmHg 37.36   DLCO COR %Predicted % 160   DLVA Predicted % 142        LAB RESULTS last 96 hours No results found.       has a past medical history of Allergy, Arthritis, Asthma, COPD (chronic obstructive pulmonary disease) (HCC), Coronary artery disease, GERD (gastroesophageal reflux disease), Hypertension, Nephritis, OSA on CPAP, Skin cancer, and Sleep apnea.   reports that he has been smoking cigars. He has quit using smokeless tobacco.  His smokeless tobacco use included chew.  Past Surgical History:  Procedure Laterality Date   COLONOSCOPY     10-15 yrs ago- normal -in [place] per pt    CORONARY ANGIOPLASTY  2012   FINGER SURGERY     dropped 100 lb WEight    INGUINAL HERNIA REPAIR Left 11/13/2017   Procedure: OPEN LEFT INGUINAL HERNIA REPAIR WITH MESH;  Surgeon: Signe Mitzie LABOR, MD;  Location: [facility] OR;  Service: General;  Laterality: Left;  GENERAL AND TAP BLOCK   INSERTION OF MESH Left 11/13/2017   Procedure: INSERTION OF MESH LEFT INGUINAL HERNIA;  Surgeon: Signe Mitzie LABOR, MD;  Location: [facility] OR;  Service: General;  Laterality: Left;  GENERAL AND TAP BLOCK   SKIN BIOPSY     WISDOM TOOTH EXTRACTION      No Known Allergies  Immunization History  Administered Date(s) Administered   Fluad  Quad(high Dose 65+) 07/10/2022   Fluad Trivalent(High Dose 65+) 08/06/2023   INFLUENZA, HIGH DOSE SEASONAL PF 07/15/2019, 07/27/2020   Influenza Split 07/27/2014   Influenza, Quadrivalent, Recombinant, Inj, Pf 08/13/2018   Influenza,inj,Quad PF,6+ Mos 07/27/2016, 07/17/2017   Moderna Sars-Covid-2 Vaccination 03/13/2020, 04/10/2020   PNEUMOCOCCAL CONJUGATE-20 08/16/2024   Pneumococcal Conjugate-13 10/27/2012   Pneumococcal Polysaccharide-23 10/27/2012    Family History  Problem Relation Age of Onset   Cancer Mother        CLL, Lymphoma   Emphysema Father    Colon cancer Neg Hx    Colon polyps Neg Hx    Esophageal cancer Neg Hx    Rectal cancer Neg Hx    Stomach cancer Neg Hx      Current Outpatient Medications:    aspirin EC 81 MG tablet, Take 81 mg by mouth daily., Disp: , Rfl:    pseudoephedrine (SUDAFED) 30 MG tablet, Take 30 mg by mouth every 4 (four) hours as needed for congestion., Disp: , Rfl:    raNITIdine HCl (ZANTAC PO), Take 1 tablet by mouth as needed., Disp: , Rfl:    valsartan-hydrochlorothiazide (DIOVAN-HCT) 160-25 MG tablet, Take 1 tablet by mouth daily., Disp: , Rfl:  albuterol  (VENTOLIN  HFA) 108 (90 Base) MCG/ACT inhaler, Inhale 2 puffs into the lungs every 4 (four) hours as needed for wheezing or shortness of breath., Disp: 13.4 g, Rfl: 6   fexofenadine (ALLEGRA) 180 MG tablet, Take 180 mg by mouth daily as needed for allergies or rhinitis. (Patient not taking: Reported on 08/16/2024), Disp: , Rfl:    fluticasone -salmeterol (ADVAIR HFA) 115-21 MCG/ACT inhaler, Inhale 2 puffs into the lungs 2 (two) times daily., Disp: 12 g, Rfl: 11      Objective:   Vitals:   08/16/24 1538  BP: 122/72  Pulse: 90  Temp: 97.8 F (36.6 C)  TempSrc: Oral  SpO2: 94%  Weight: 216 lb (98 kg)  Height: 5' 6 (1.676 m)    Estimated body mass index is 34.86 kg/m as calculated from the following:   Height as of this encounter: 5' 6 (1.676 m).   Weight as of this encounter:  216 lb (98 kg).  @WEIGHTCHANGE @  [employer]   08/16/24 1538  Weight: 216 lb (98 kg)     Physical Exam   General: No distress. Looks well O2 at rest: looks wel Cane present: no Sitting in wheel chair: no Frail: no Obese: yes Neuro: Alert and Oriented x 3. GCS 15. Speech normal Psych: Pleasant Resp:  Barrel Chest - no.  Wheeze - no, Crackles - no, No overt respiratory distress CVS: Normal heart sounds. Murmurs - no Ext: Stigmata of Connective Tissue Disease - no HEENT: Normal upper airway. PEERL +. No post nasal drip        Assessment/     Assessment & Plan Asthma, moderate persistent, well-controlled  Vaccine counseling  Pneumococcal vaccination given    PLAN Patient Instructions     ICD-10-CM   1. Asthma, moderate persistent, well-controlled  J45.40     2. Vaccine counseling  Z71.85      Asthma  Currently asthma is well controlled and stable with advair HFA use 1-2 puffs daily and without singulair  use   Plan -Continue advair scheduled at 2 puff twice daily -Use albuterol  as needed - CMA to ensure 3 month supply with [facility]  Vaccine counseling   - prevnar-20 08/16/2024 - recommend RSV vaccine  in 2025 (last in fall 2023) - high dose flu shot on your own  Cigars  - very small amount of smoking  Plan  -does not meet threshold for lung cancer screening   Followup - 12 months or sooner; ACT score at follow-up -Any respiratory exacerbation symptoms please call us  or come sooner    FOLLOWUP    Return in about 1 year (around 08/16/2025) for 15 min visit, Face to Face Visit.    SIGNATURE    Dr. Dorethia Cave, M.D., F.C.C.P,  Pulmonary and Critical Care Medicine Staff Physician, [facility] Center Director - Interstitial Lung Disease  Program  Pulmonary Fibrosis [facility] [place], [place], [address]  Pager: [phone], If no answer or between  15:00h - 7:00h: call [phone]   [phone] Telephone: [phone]  10:22 PM 08/16/2024

## 2024-08-16 NOTE — Patient Instructions (Addendum)
 ICD-10-CM   1. Asthma, moderate persistent, well-controlled  J45.40     2. Vaccine counseling  Z71.85      Asthma  Currently asthma is well controlled and stable with advair HFA use 1-2 puffs daily and without singulair  use   Plan -Continue advair scheduled at 2 puff twice daily -Use albuterol  as needed - CMA to ensure 3 month supply with [facility]  Vaccine counseling   - prevnar-20 08/16/2024 - recommend RSV vaccine  in 2025 (last in fall 2023) - high dose flu shot on your own  Cigars  - very small amount of smoking  Plan  -does not meet threshold for lung cancer screening   Followup - 12 months or sooner; ACT score at follow-up -Any respiratory exacerbation symptoms please call us  or come sooner

## 2024-09-28 ENCOUNTER — Other Ambulatory Visit: Payer: Self-pay | Admitting: Internal Medicine

## 2024-09-28 DIAGNOSIS — E8881 Metabolic syndrome: Secondary | ICD-10-CM

## 2024-10-31 ENCOUNTER — Ambulatory Visit
Admission: RE | Admit: 2024-10-31 | Discharge: 2024-10-31 | Disposition: A | Discharge status: Home / Self Care | Source: Ambulatory Visit | Attending: Internal Medicine | Admitting: Internal Medicine

## 2024-10-31 ENCOUNTER — Other Ambulatory Visit

## 2024-10-31 DIAGNOSIS — E8881 Metabolic syndrome: Secondary | ICD-10-CM
